# Patient Record
Sex: Male | Born: 1970 | Race: White | Hispanic: No | Smoking: Never smoker
Health system: Southern US, Community
[De-identification: ages and names within clinical notes are randomized; demographics above are authoritative.]

## PROBLEM LIST (undated history)

## (undated) DIAGNOSIS — N200 Calculus of kidney: Secondary | ICD-10-CM

## (undated) DIAGNOSIS — N289 Disorder of kidney and ureter, unspecified: Secondary | ICD-10-CM

## (undated) DIAGNOSIS — F1010 Alcohol abuse, uncomplicated: Secondary | ICD-10-CM

## (undated) HISTORY — PX: KIDNEY STONE SURGERY: SHX686

---

## 2004-05-09 ENCOUNTER — Ambulatory Visit: Payer: Self-pay | Admitting: Family Medicine

## 2004-07-28 ENCOUNTER — Ambulatory Visit: Payer: Self-pay | Admitting: Internal Medicine

## 2004-09-30 ENCOUNTER — Emergency Department (HOSPITAL_COMMUNITY): Admission: EM | Admit: 2004-09-30 | Discharge: 2004-10-01 | Payer: Self-pay | Admitting: Emergency Medicine

## 2007-02-04 IMAGING — CT CT CERVICAL SPINE W/O CM
3 series · 13 of 20 positions shown, 15 images · non-contrast
Comparison: None.
COMPARISON: None.

CLINICAL DATA: Trauma. Frontal contusion. Headache. Neck pain.

CRANIAL CT WITHOUT CONTRAST  10/01/2004:
TECHNIQUE: 5mm axial images were obtained from the skull base through the
vertex without intravenous contrast.
TECHNIQUE: Multidetector CT imaging of the cervical spine was performed without
contrast.  Sagittal and coronal plane reformatted images were reconstructed from
the axial CT data, and were also reviewed.

[Series 3: c_spine 3.0 b30s bone · axial · 0.26mm/px · z∈[-310,-184]mm · 7 of 57 slices shown, 9 images]
[im 8/57  soft-tissue]
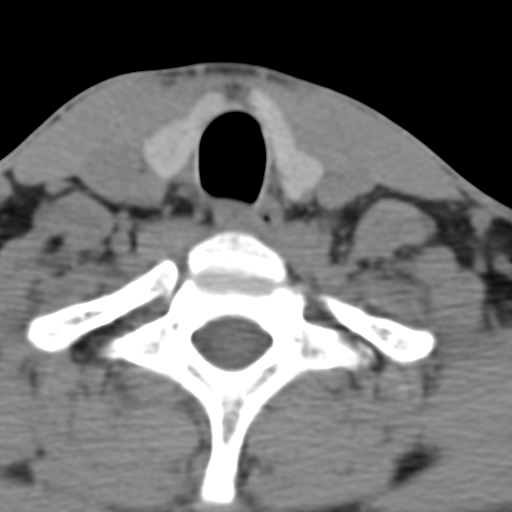
[im 8/57  bone]
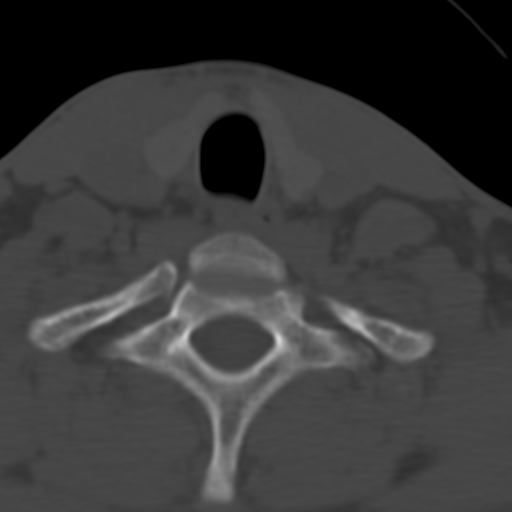
[im 15/57  bone]
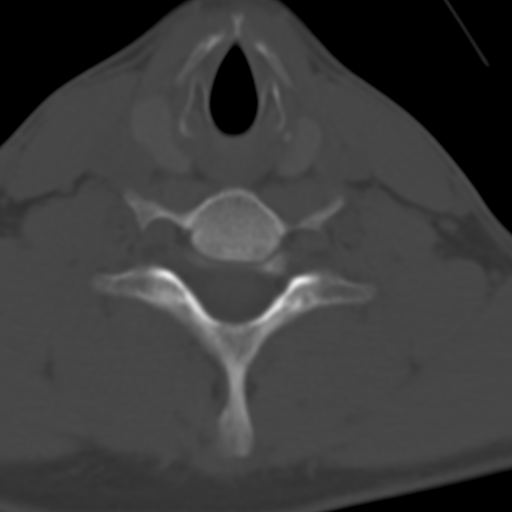
[im 22/57  bone]
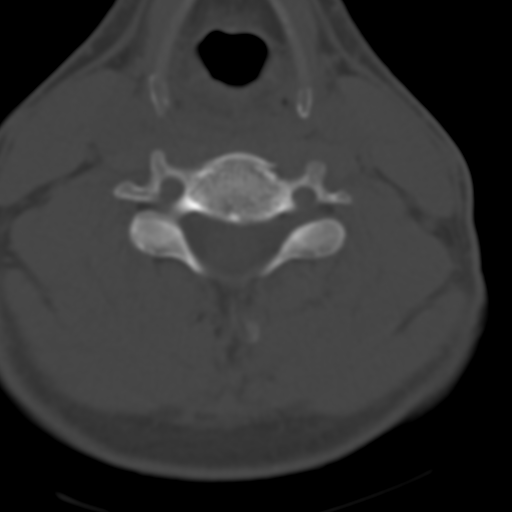
[im 29/57  bone]
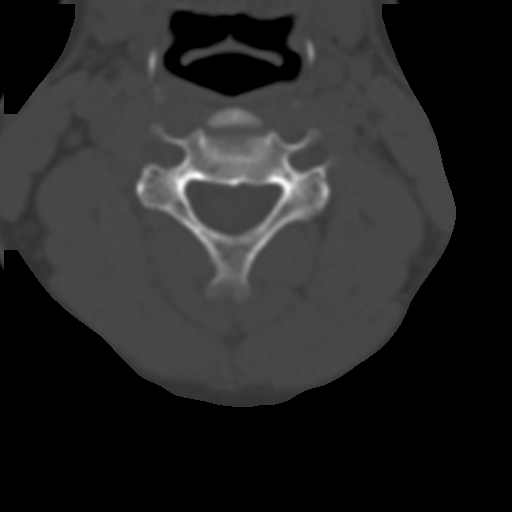
[im 36/57  soft-tissue]
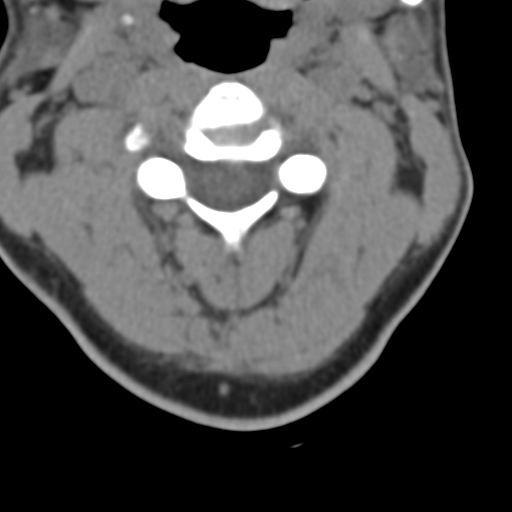
[im 36/57  bone]
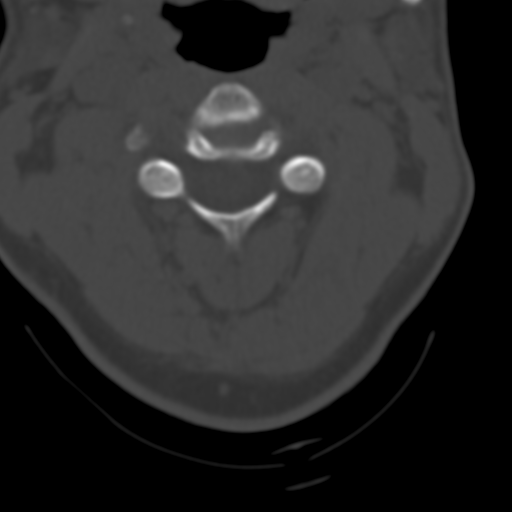
[im 43/57  bone]
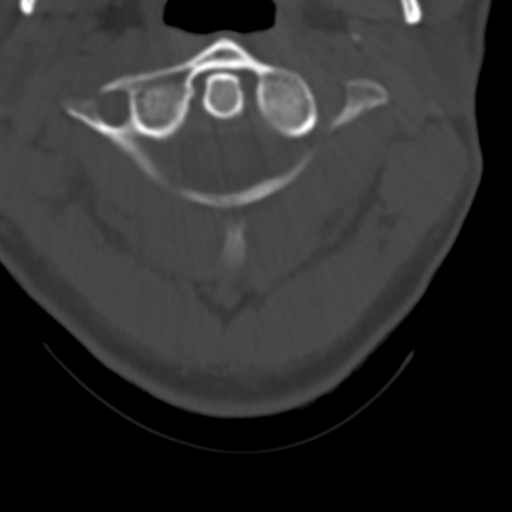
[im 50/57  bone]
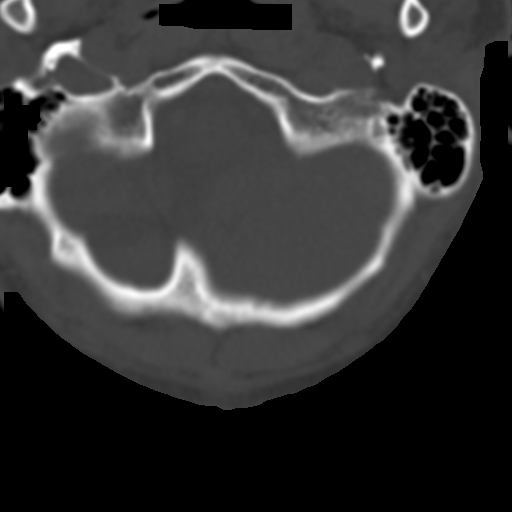

[Series 4: head_seq 4.5 h42s st · axial · 0.43mm/px · z∈[-138,-66]mm · 3 of 32 slices shown]
[im 8/32  bone]
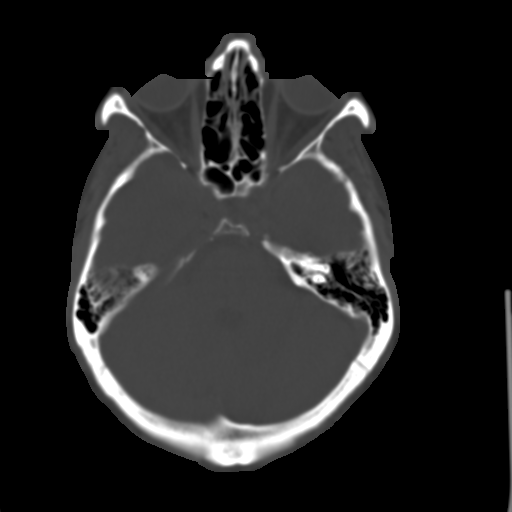
[im 16/32  bone]
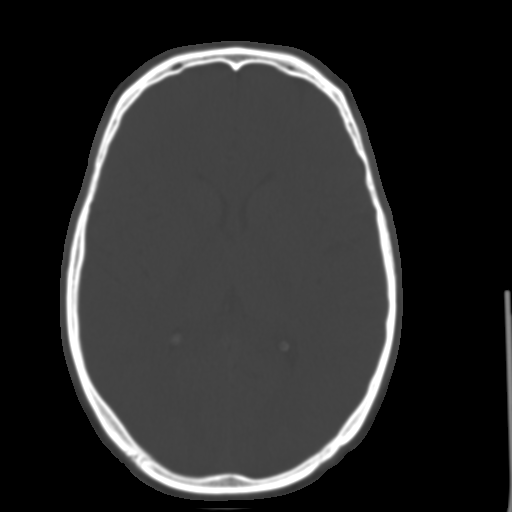
[im 24/32  bone]
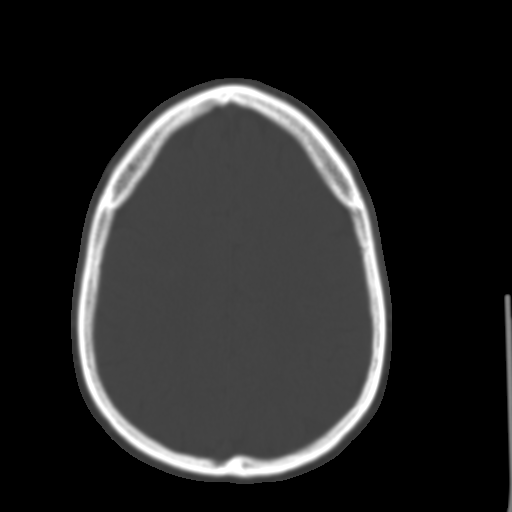

[Series 603: coronal cervical · coronal · 0.35mm/px · 3 of 21 slices shown]
[im 5/21  bone]
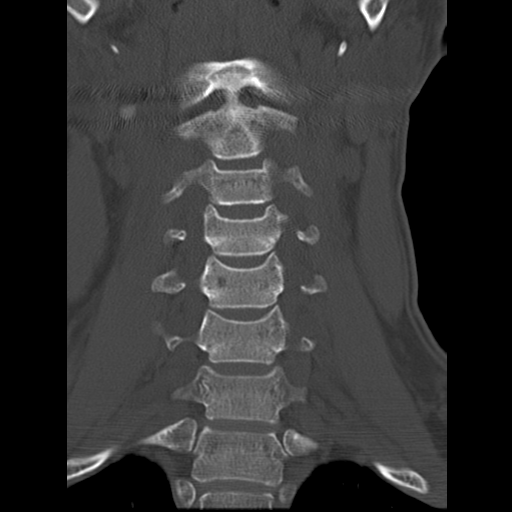
[im 9/21  bone]
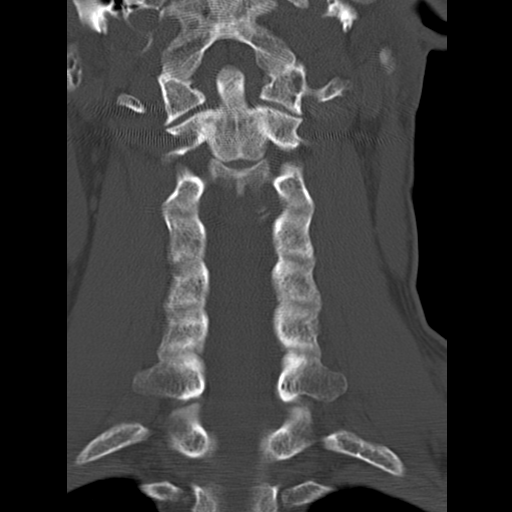
[im 13/21  bone]
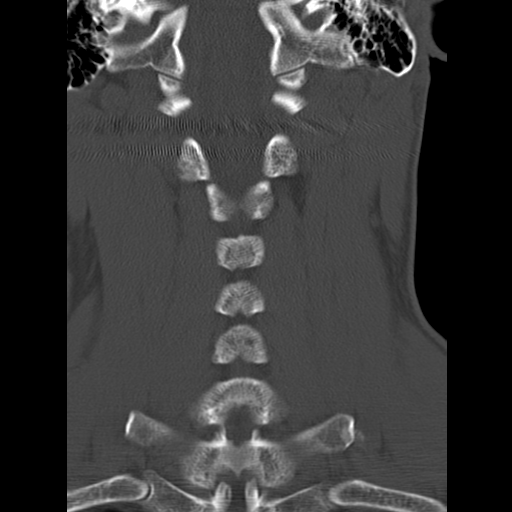

[13 of 20 positions shown; findings below may reference images not displayed]

FINDINGS: The ventricular system is normal in size and appearance for age.
There is no mass-effect or midline shift. There is no hemorrhage or hematoma. No
extra-axial fluid collections are identified. There are no focal brain
parenchymal abnormalities.

The bone window images demonstrate no skull fractures. The mastoid air cells
appear well-aerated. There is a mucous retention cyst or polyp in the right
maxillary sinus. Mucosal thickening is present in the ethmoid air cells
bilaterally. Note is made of a small subcutaneous/subgaleal hematoma in the
right frontal region.
IMPRESSION: 1. Normal intracranially.

2. Chronic right maxillary and bilateral ethmoid sinusitis. 

CT CERVICAL SPINE WITHOUT CONTRAST 10/01/2004:
FINDINGS: No fractures are identified involving the cervical spine. There is no
evidence of spinal stenosis. The sagittal reconstructed images show
straightening of the usual lordosis. Posterior alignment appears anatomic.
Prevertebral soft tissues are normal. There is calcification in the anterior
annular fibers of C6-C7. The facet joints are intact throughout. The coronal
reconstructed images show the dens to be intact. The C1-C2 articulation is
normal. The lateral masses are intact.
IMPRESSION: No cervical spine fracture is identified.

## 2016-06-10 ENCOUNTER — Emergency Department
Admission: EM | Admit: 2016-06-10 | Discharge: 2016-06-10 | Disposition: A | Discharge status: Home / Self Care | Payer: Self-pay | Attending: Emergency Medicine | Admitting: Emergency Medicine

## 2016-06-10 ENCOUNTER — Encounter: Payer: Self-pay | Admitting: Emergency Medicine

## 2016-06-10 DIAGNOSIS — F1010 Alcohol abuse, uncomplicated: Secondary | ICD-10-CM

## 2016-06-10 DIAGNOSIS — F10920 Alcohol use, unspecified with intoxication, uncomplicated: Secondary | ICD-10-CM

## 2016-06-10 DIAGNOSIS — F17290 Nicotine dependence, other tobacco product, uncomplicated: Secondary | ICD-10-CM | POA: Insufficient documentation

## 2016-06-10 DIAGNOSIS — F10120 Alcohol abuse with intoxication, uncomplicated: Secondary | ICD-10-CM | POA: Insufficient documentation

## 2016-06-10 HISTORY — DX: Calculus of kidney: N20.0

## 2016-06-10 HISTORY — DX: Disorder of kidney and ureter, unspecified: N28.9

## 2016-06-10 HISTORY — DX: Alcohol abuse, uncomplicated: F10.10

## 2016-06-10 LAB — COMPREHENSIVE METABOLIC PANEL
ALT: 46 U/L (ref 17–63)
AST: 55 U/L — AB (ref 15–41)
Albumin: 4.4 g/dL (ref 3.5–5.0)
Alkaline Phosphatase: 58 U/L (ref 38–126)
Anion gap: 8 (ref 5–15)
BILIRUBIN TOTAL: 0.6 mg/dL (ref 0.3–1.2)
BUN: 6 mg/dL (ref 6–20)
CHLORIDE: 108 mmol/L (ref 101–111)
CO2: 25 mmol/L (ref 22–32)
CREATININE: 0.82 mg/dL (ref 0.61–1.24)
Calcium: 8.9 mg/dL (ref 8.9–10.3)
GFR calc Af Amer: >60 mL/min (ref >60)
Glucose, Bld: 103 mg/dL — ABNORMAL HIGH (ref 65–99)
Potassium: 3.8 mmol/L (ref 3.5–5.1)
Sodium: 141 mmol/L (ref 135–145)
TOTAL PROTEIN: 7.3 g/dL (ref 6.5–8.1)

## 2016-06-10 LAB — SALICYLATE LEVEL: Salicylate Lvl: <7 mg/dL (ref 2.8–30.0)

## 2016-06-10 LAB — CBC WITH DIFFERENTIAL/PLATELET
BASOS ABS: 0.1 10*3/uL (ref 0–0.1)
Basophils Relative: 1 %
Eosinophils Absolute: 0.2 10*3/uL (ref 0–0.7)
Eosinophils Relative: 5 %
HEMATOCRIT: 43 % (ref 40.0–52.0)
HEMOGLOBIN: 15.1 g/dL (ref 13.0–18.0)
LYMPHS PCT: 40 %
Lymphs Abs: 1.8 10*3/uL (ref 1.0–3.6)
MCH: 35.3 pg — ABNORMAL HIGH (ref 26.0–34.0)
MCHC: 35.1 g/dL (ref 32.0–36.0)
MCV: 100.5 fL — AB (ref 80.0–100.0)
Monocytes Absolute: 0.5 10*3/uL (ref 0.2–1.0)
Monocytes Relative: 11 %
NEUTROS ABS: 2 10*3/uL (ref 1.4–6.5)
Neutrophils Relative %: 43 %
Platelets: 304 10*3/uL (ref 150–440)
RBC: 4.28 MIL/uL — AB (ref 4.40–5.90)
RDW: 14.8 % — ABNORMAL HIGH (ref 11.5–14.5)
WBC: 4.6 10*3/uL (ref 3.8–10.6)

## 2016-06-10 LAB — ETHANOL
ALCOHOL ETHYL (B): 200 mg/dL — AB (ref <5)
Alcohol, Ethyl (B): 111 mg/dL — ABNORMAL HIGH (ref <5)

## 2016-06-10 LAB — ACETAMINOPHEN LEVEL: Acetaminophen (Tylenol), Serum: <10 ug/mL — ABNORMAL LOW (ref 10–30)

## 2016-06-10 MED ORDER — THIAMINE HCL 100 MG/ML IJ SOLN
Freq: Once | INTRAVENOUS | Status: AC
  Administered 2016-06-10: 05:00:00 via INTRAVENOUS
  Filled 2016-06-10: qty 1000

## 2016-06-10 MED ORDER — LORAZEPAM 2 MG PO TABS: 0.0000 mg | ORAL_TABLET | Freq: Four times a day (QID) | ORAL | Status: DC

## 2016-06-10 MED ORDER — LORAZEPAM 2 MG/ML IJ SOLN
0.0000 mg | Freq: Four times a day (QID) | INTRAMUSCULAR | Status: DC
Start: 2016-06-10 — End: 2016-06-10

## 2016-06-10 MED ORDER — VITAMIN B-1 100 MG PO TABS
100.0000 mg | ORAL_TABLET | Freq: Every day | ORAL | Status: DC
  Administered 2016-06-10: 100 mg via ORAL
  Filled 2016-06-10: qty 1

## 2016-06-10 MED ORDER — LORAZEPAM 1 MG PO TABS
1.0000 mg | ORAL_TABLET | Freq: Once | ORAL | Status: AC
  Administered 2016-06-10: 1 mg via ORAL
  Filled 2016-06-10: qty 1

## 2016-06-10 NOTE — Discharge Instructions (Signed)
Proceed directly to [facility].  Return to the ER for worsening symptoms, persistent vomiting, difficulty breathing or other concerns. °

## 2016-06-10 NOTE — ED Notes (Signed)
BEHAVIORAL HEALTH ROUNDING Patient sleeping: Yes.   Patient alert and oriented: not applicable SLEEPING Behavior appropriate: Yes.  ; If no, describe: SLEEPING Nutrition and fluids offered: No SLEEPING Toileting and hygiene offered: NoSLEEPING Sitter present: not applicable, Q 15 min safety rounds and observation. Law enforcement present: Yes ODS 

## 2016-06-10 NOTE — ED Notes (Signed)

## 2016-06-10 NOTE — ED Provider Notes (Signed)
[facility] Emergency Department Provider Note   ____________________________________________   First MD Initiated Contact with Patient 06/10/16 0400     (approximate)  I have reviewed the triage vital signs and the nursing notes.   HISTORY  Chief Complaint Alcohol Intoxication    HPI Bishop Limboimothy Keddy is a 45 y.o. male who presents to the ED from home with a chief complaint of seeking alcohol detox. Patient reports he is a "severe alcoholic" with prior DTs. His mother kicked him out of the home last evening after he lost his job secondary to his drinking. [facility] went to [facility] for [facility] who referred him to the emergency department for breathalyzer over 200. Denies SI/HI/AH/VH. Last drink was approximately 8 PM. Complains of restlessness and tremors. Denies recent fever, chills, chest pain, shortness of breath, abdominal pain, nausea, vomiting, diarrhea. Denies recent travel or trauma. Nothing makes his symptoms better or worse.   Past Medical History:  Diagnosis Date  . Alcohol abuse   . Kidney stone   . Renal disorder     There are no active problems to display for this patient.   Past Surgical History:  Procedure Laterality Date  . KIDNEY STONE SURGERY      Prior to Admission medications   Not on File    Allergies Patient has no known allergies.  No family history on file.  Social History Social History  Substance Use Topics  . Smoking status: Never Smoker  . Smokeless tobacco: Current User    Types: Snuff  . Alcohol use Yes     Comment: 1 pt of vodka a day or a 5th of wine a day    Review of Systems  Constitutional: No fever/chills. Eyes: No visual changes. ENT: No sore throat. Cardiovascular: Denies chest pain. Respiratory: Denies shortness of breath. Gastrointestinal: No abdominal pain.  No nausea, no vomiting.  No diarrhea.  No constipation. Genitourinary: Negative for dysuria. Musculoskeletal: Negative for back  pain. Skin: Negative for rash. Neurological: Negative for headaches, focal weakness or numbness. Psychiatric:Positive for alcoholism  10-point ROS otherwise negative.  ____________________________________________   PHYSICAL EXAM:  VITAL SIGNS: ED Triage Vitals  Enc Vitals Group     BP 06/10/16 0345 (!) 149/105     Pulse Rate 06/10/16 0345 (!) 103     Resp 06/10/16 0345 20     Temp 06/10/16 0345 97.5 F (36.4 C)     Temp Source 06/10/16 0345 Oral     SpO2 06/10/16 0345 100 %     Weight 06/10/16 0346 150 lb (68 kg)     Height 06/10/16 0346 5\' 7"  (1.702 m)     Head Circumference --      Peak Flow --      Pain Score --      Pain Loc --      Pain Edu? --      Excl. in GC? --     Constitutional: Alert and oriented. Intoxicated appearing and in no acute distress. Eyes: Conjunctivae are normal. PERRL. EOMI. Head: Atraumatic. Nose: No congestion/rhinnorhea. Mouth/Throat: Mucous membranes are moist.  Oropharynx non-erythematous. Neck: No stridor.   Cardiovascular: Normal rate, regular rhythm. Grossly normal heart sounds.  Good peripheral circulation. Respiratory: Normal respiratory effort.  No retractions. Lungs CTAB. Gastrointestinal: Soft and nontender. No distention. No abdominal bruits. No CVA tenderness. Musculoskeletal: No lower extremity tenderness nor edema.  No joint effusions. Neurologic:  Normal speech and language. No gross focal neurologic deficits are appreciated. No gait instability.  Skin:  Skin is warm, dry and intact. No rash noted. Psychiatric: Mood and affect are normal. Speech and behavior are normal.  ____________________________________________   LABS (all labs ordered are listed, but only abnormal results are displayed)  Labs Reviewed  CBC WITH DIFFERENTIAL/PLATELET - Abnormal; Notable for the following:       Result Value   RBC 4.28 (*)    MCV 100.5 (*)    MCH 35.3 (*)    RDW 14.8 (*)    All other components within normal limits  COMPREHENSIVE  METABOLIC PANEL - Abnormal; Notable for the following:    Glucose, Bld 103 (*)    AST 55 (*)    All other components within normal limits  ETHANOL - Abnormal; Notable for the following:    Alcohol, Ethyl (B) 200 (*)    All other components within normal limits  ACETAMINOPHEN LEVEL - Abnormal; Notable for the following:    Acetaminophen (Tylenol), Serum <10 (*)    All other components within normal limits  SALICYLATE LEVEL   ____________________________________________  EKG  None ____________________________________________  RADIOLOGY  None ____________________________________________   PROCEDURES  Procedure(s) performed: None  Procedures  Critical Care performed: No  ____________________________________________   INITIAL IMPRESSION / ASSESSMENT AND PLAN / ED COURSE  Pertinent labs & imaging results that were available during my care of the patient were reviewed by me and considered in my medical decision making (see chart for details).  45 year old male with EtOH dependency who presents to the ED requesting detox after he was sent from [facility] for medical clearance. EtOH level is 200; [facility] will accept at 100 or below. Will infuse banana bag, place patient on CIWA protocol and observe.  Clinical Course as of Jun 10 652  Wynelle LinkSun Jun 10, 2016  [id number] [facility] requested a repeat EtOH level which will be drawn at 8 AM. Hopefully patient will be able to go to [facility] for detox.  [JS]    Clinical Course User Index [JS] Irean HongJade J Sung, MD     ____________________________________________   FINAL CLINICAL IMPRESSION(S) / ED DIAGNOSES  Final diagnoses:  ETOH abuse  Acute alcoholic intoxication without complication (HCC)      NEW MEDICATIONS STARTED DURING THIS VISIT:  New Prescriptions   No medications on file     Note:  This document was prepared using Dragon voice recognition software and may include unintentional dictation errors.    Irean HongJade J Sung, MD 06/10/16 [phone]

## 2016-06-10 NOTE — ED Notes (Signed)
BEHAVIORAL HEALTH ROUNDING Patient sleeping: Yes.   Patient alert and oriented: not applicable Behavior appropriate: Yes.  ; If no, describe:  Nutrition and fluids offered: No Toileting and hygiene offered: No Sitter present: not applicable Law enforcement present: Yes  

## 2016-06-10 NOTE — BH Specialist Note (Signed)
This Clinical research associatewriter spoke with Joni ReiningNicole [facility] intake nurse who is agreeing to the accept the patient with the current BAC results.  She reports that their transportation person is not available at this time. This Clinical research associatewriter contacted Lurena JoinerRebecca with [employer] [facility] [facility] who reports a Zenaida Niecevan will be here to pick up the patient in 35-40 minutes.  This information was communicated with Annette StableBill, RN and he agreed.     Maryelizabeth Rowanressa Baileigh Modisette, MSW, Clare CharonLCSW, LCAS [facility] Triage Specialist [phone]

## 2016-06-10 NOTE — BH Assessment (Signed)
Assessment Note  Joseph Walters is an 45 y.o. male  presenting to the ED with concerns  of increased depression and anxiety as well as alcoholism.  Patient denies SI/HI/Psychosis/SIB. He reports daily use of ETOH. Patient reports drinking about a pint of wine/liquor daily but states he drank a fifth of liquor yesterday. ETOH level of arrival at ED = 200. Patient reports recent job loss associated with ETOH use as well as financial and family problems he attributes to his addiction. Patient denies any current MH/SA treatment.   Diagnosis: Alcohol abuse  Past Medical History:  Past Medical History:  Diagnosis Date  . Alcohol abuse   . Kidney stone   . Renal disorder     Past Surgical History:  Procedure Laterality Date  . KIDNEY STONE SURGERY      Family History: No family history on file.  Social History:  reports that he has never smoked. His smokeless tobacco use includes Snuff. He reports that he drinks alcohol. He reports that he does not use drugs.  Additional Social History:  Alcohol / Drug Use Pain Medications: See PTA Prescriptions: See PTA Over the Counter: See PTA History of alcohol / drug use?: Yes Longest period of sobriety (when/how long): 9 months Negative Consequences of Use: Financial, Armed forces operational officerLegal, Work / Programmer, multimediachool, Personal relationships Withdrawal Symptoms: Tremors Substance #1 Name of Substance 1: ETOH 1 - Age of First Use: 37 1 - Amount (size/oz): 1/5 liquor 1 - Frequency: daily 1 - Duration: past couple of months 1 - Last Use / Amount: 06/09/2016  CIWA: CIWA-Ar BP: (!) 149/105 Pulse Rate: (!) 103 Nausea and Vomiting: no nausea and no vomiting Tactile Disturbances: none Tremor: not visible, but can be felt fingertip to fingertip Auditory Disturbances: not present Paroxysmal Sweats: no sweat visible Visual Disturbances: not present Anxiety: mildly anxious Headache, Fullness in Head: mild Agitation: normal activity Orientation and Clouding of Sensorium:  oriented and can do serial additions CIWA-Ar Total: 4 COWS:    Allergies: No Known Allergies  Home Medications:  (Not in a hospital admission)  OB/GYN Status:  No LMP for male patient.  General Assessment Data [facility] Assessment: In system Is this a Tele or Face-to-Face Assessment?: Face-to-Face Is this an Initial Assessment or a Re-assessment for this encounter?: Initial Assessment Marital status: Single Maiden name: n/a Is patient pregnant?: No Pregnancy Status: No Living Arrangements: Parent Can pt return to current living arrangement?: Yes Admission Status: Voluntary Is patient capable of signing voluntary admission?: Yes Referral Source: Self/Family/Friend Insurance type: None  Medical Screening Exam [facility] Walk-in ONLY) Medical Exam completed: Yes  Crisis Care Plan Living Arrangements: Parent Legal Guardian: Other: (self) Name of Psychiatrist: none reported Name of Therapist: none reported  Education Status Is patient currently in school?: No Current Grade: n/a Highest grade of school patient has completed: n/a Name of school: n/a Contact person: n/a  Risk to self with the past 6 months Suicidal Ideation: No Has patient been a risk to self within the past 6 months prior to admission? : No Suicidal Intent: No Has patient had any suicidal intent within the past 6 months prior to admission? : No Is patient at risk for suicide?: No Suicidal Plan?: No Has patient had any suicidal plan within the past 6 months prior to admission? : No Access to Means: No What has been your use of drugs/alcohol within the last 12 months?: alcohol,1/5 pint daily Previous Attempts/Gestures: No How many times?: 0 Other Self Harm Risks:  none identified Triggers for Past Attempts: None known Intentional Self Injurious Behavior: None Family Suicide History: No Recent stressful life event(s): Legal Issues, Job Loss Persecutory voices/beliefs?:  No Depression: Yes Depression Symptoms: Loss of interest in usual pleasures, Feeling worthless/self pity Substance abuse history and/or treatment for substance abuse?: Yes Suicide prevention information given to non-admitted patients: Not applicable  Risk to Others within the past 6 months Homicidal Ideation: No Does patient have any lifetime risk of violence toward others beyond the six months prior to admission? : No Thoughts of Harm to Others: No Current Homicidal Intent: No Current Homicidal Plan: No Access to Homicidal Means: No Identified Victim: none identified History of harm to others?: No Assessment of Violence: None Noted Violent Behavior Description: none  identified Does patient have access to weapons?: No Criminal Charges Pending?: Yes Describe Pending Criminal Charges: DWI Does patient have a court date: Yes Court Date: 08/03/15 Is patient on probation?: No  Psychosis Hallucinations: None noted Delusions: None noted  Mental Status Report Appearance/Hygiene: Unremarkable Eye Contact: Good Motor Activity: Freedom of movement, Tremors Speech: Logical/coherent Level of Consciousness: Drowsy Mood: Depressed, Anxious Affect: Anxious, Depressed Anxiety Level: Minimal Thought Processes: Coherent, Relevant Judgement: Unimpaired Orientation: Place, Person, Time, Situation Obsessive Compulsive Thoughts/Behaviors: None  Cognitive Functioning Concentration: Good Memory: Recent Intact, Remote Intact IQ: Average Insight: Fair Impulse Control: Fair Appetite: Fair Weight Loss: 0 Weight Gain: 0 Sleep: No Change Vegetative Symptoms: None  ADLScreening [facility] Assessment Services) Patient's cognitive ability adequate to safely complete daily activities?: Yes Patient able to express need for assistance with ADLs?: Yes Independently performs ADLs?: Yes (appropriate for developmental age)  Prior Inpatient Therapy Prior Inpatient Therapy: No Prior Therapy Dates:  n/a Prior Therapy Facilty/Provider(s): n/a Reason for Treatment: n/a  Prior Outpatient Therapy Prior Outpatient Therapy: No Prior Therapy Dates: n/a Prior Therapy Facilty/Provider(s): n/a Reason for Treatment: n/a Does patient have an ACCT team?: No Does patient have Intensive In-House Services?  : No Does patient have [facility] services? : No Does patient have P4CC services?: No  ADL Screening (condition at time of admission) Patient's cognitive ability adequate to safely complete daily activities?: Yes Patient able to express need for assistance with ADLs?: Yes Independently performs ADLs?: Yes (appropriate for developmental age)       Abuse/Neglect Assessment (Assessment to be complete while patient is alone) Physical Abuse: Denies Verbal Abuse: Denies Sexual Abuse: Denies Exploitation of patient/patient's resources: Denies Self-Neglect: Denies Values / Beliefs Cultural Requests During Hospitalization: None Spiritual Requests During Hospitalization: None Consults Spiritual Care Consult Needed: No Social Work Consult Needed: No Merchant navy officerAdvance Directives (For Healthcare) Does Patient Have a Medical Advance Directive?: No Would patient like information on creating a medical advance directive?: No - Patient declined    Additional Information 1:1 In Past 12 Months?: No CIRT Risk: No Elopement Risk: No Does patient have medical clearance?: No     Disposition:  Disposition Initial Assessment Completed for this Encounter: Yes Disposition of Patient: Referred to Patient referred to: [facility]  On Site Evaluation by:   Reviewed with Physician:    Artist Beachoxana C Destry Dauber 06/10/2016 5:18 AM

## 2016-06-10 NOTE — ED Provider Notes (Signed)
[facility]  I accepted care from Dr. Dolores FrameSung ____________________________________________    LABS (pertinent positives/negatives)  Labs Reviewed  CBC WITH DIFFERENTIAL/PLATELET - Abnormal; Notable for the following:       Result Value   RBC 4.28 (*)    MCV 100.5 (*)    MCH 35.3 (*)    RDW 14.8 (*)    All other components within normal limits  COMPREHENSIVE METABOLIC PANEL - Abnormal; Notable for the following:    Glucose, Bld 103 (*)    AST 55 (*)    All other components within normal limits  ETHANOL - Abnormal; Notable for the following:    Alcohol, Ethyl (B) 200 (*)    All other components within normal limits  ACETAMINOPHEN LEVEL - Abnormal; Notable for the following:    Acetaminophen (Tylenol), Serum <10 (*)    All other components within normal limits  ETHANOL - Abnormal; Notable for the following:    Alcohol, Ethyl (B) 111 (*)    All other components within normal limits  SALICYLATE LEVEL     ____________________________________________    RADIOLOGY All xrays were viewed by me. Imaging interpreted by radiologist.  None  ____________________________________________   PROCEDURES  Procedure(s) performed: None  Critical Care performed: None  ____________________________________________   INITIAL IMPRESSION / ASSESSMENT AND PLAN / ED COURSE   Pertinent labs & imaging results that were available during my care of the patient were reviewed by me and considered in my medical decision making (see chart for details).  Patient voluntarily here to be evaluated because he went to [facility] and they requested his alcohol level be hear 100.  Repeat is 111, patient is calm and cooperative.  States he feels jittery, given a dose of ativan, but appears safe for discharge to [facility]. Patient will be discharged to go to [facility].  CONSULTATIONS: None    Patient / Family / Caregiver informed of clinical course, medical decision-making process, and agree with  plan.   I discussed return precautions, follow-up instructions, and discharged instructions with patient and/or family.     ____________________________________________   FINAL CLINICAL IMPRESSION(S) / ED DIAGNOSES  Final diagnoses:  ETOH abuse  Acute alcoholic intoxication without complication (HCC)        Governor Rooksebecca Finlay Godbee, MD 06/10/16 1110

## 2016-06-10 NOTE — ED Notes (Signed)
BEHAVIORAL HEALTH ROUNDING Patient sleeping: Yes.   Patient alert and oriented: not applicable Behavior appropriate: Yes.  ; If no, describe:  Nutrition and fluids offered: No Toileting and hygiene offered: Yes  Sitter present: not applicable Law enforcement present: Yes  

## 2016-06-10 NOTE — ED Triage Notes (Signed)
Pt states he "is a severe alcoholic" and he is having trouble stopping. Last drink was around 2000 last night. Pt states he has attempted to stop several times but has seizures and tremors from alcohol withdrawals. Pt states he wants to quit but he cant do it on his own.

## 2017-07-02 ENCOUNTER — Emergency Department (HOSPITAL_COMMUNITY)
Admission: EM | Admit: 2017-07-02 | Discharge: 2017-07-02 | Disposition: A | Discharge status: Home / Self Care | Payer: Self-pay | Attending: Physician Assistant | Admitting: Physician Assistant

## 2017-07-02 ENCOUNTER — Encounter (HOSPITAL_COMMUNITY): Payer: Self-pay

## 2017-07-02 ENCOUNTER — Other Ambulatory Visit: Payer: Self-pay

## 2017-07-02 DIAGNOSIS — R109 Unspecified abdominal pain: Secondary | ICD-10-CM | POA: Insufficient documentation

## 2017-07-02 DIAGNOSIS — G4700 Insomnia, unspecified: Secondary | ICD-10-CM | POA: Insufficient documentation

## 2017-07-02 LAB — URINALYSIS, ROUTINE W REFLEX MICROSCOPIC
Bilirubin Urine: NEGATIVE
GLUCOSE, UA: NEGATIVE mg/dL
Ketones, ur: NEGATIVE mg/dL
Leukocytes, UA: NEGATIVE
Nitrite: NEGATIVE
PROTEIN: NEGATIVE mg/dL
Specific Gravity, Urine: 1.014 (ref 1.005–1.030)
Squamous Epithelial / LPF: NONE SEEN
pH: 7 (ref 5.0–8.0)

## 2017-07-02 LAB — COMPREHENSIVE METABOLIC PANEL
ALBUMIN: 3.9 g/dL (ref 3.5–5.0)
ALK PHOS: 59 U/L (ref 38–126)
ALT: 40 U/L (ref 17–63)
AST: 30 U/L (ref 15–41)
Anion gap: 7 (ref 5–15)
BUN: 9 mg/dL (ref 6–20)
CALCIUM: 9.2 mg/dL (ref 8.9–10.3)
CO2: 30 mmol/L (ref 22–32)
CREATININE: 0.74 mg/dL (ref 0.61–1.24)
Chloride: 101 mmol/L (ref 101–111)
GFR calc Af Amer: >60 mL/min (ref >60)
GFR calc non Af Amer: >60 mL/min (ref >60)
GLUCOSE: 92 mg/dL (ref 65–99)
Potassium: 3.8 mmol/L (ref 3.5–5.1)
SODIUM: 138 mmol/L (ref 135–145)
Total Bilirubin: 0.4 mg/dL (ref 0.3–1.2)
Total Protein: 6.7 g/dL (ref 6.5–8.1)

## 2017-07-02 LAB — CBC
HCT: 37.8 % — ABNORMAL LOW (ref 39.0–52.0)
HEMOGLOBIN: 12.9 g/dL — AB (ref 13.0–17.0)
MCH: 33.4 pg (ref 26.0–34.0)
MCHC: 34.1 g/dL (ref 30.0–36.0)
MCV: 97.9 fL (ref 78.0–100.0)
PLATELETS: 221 10*3/uL (ref 150–400)
RBC: 3.86 MIL/uL — ABNORMAL LOW (ref 4.22–5.81)
RDW: 13.5 % (ref 11.5–15.5)
WBC: 3.4 10*3/uL — ABNORMAL LOW (ref 4.0–10.5)

## 2017-07-02 LAB — LIPASE, BLOOD: Lipase: 40 U/L (ref 11–51)

## 2017-07-02 LAB — POC OCCULT BLOOD, ED: Fecal Occult Bld: NEGATIVE

## 2017-07-02 MED ORDER — DICYCLOMINE HCL 20 MG PO TABS: 20.0000 mg | ORAL_TABLET | Freq: Three times a day (TID) | ORAL | 0 refills | Status: DC

## 2017-07-02 NOTE — ED Provider Notes (Signed)
[facility]   CSN: [id number] Arrival date & time: 07/02/17  [id number]     History   Chief Complaint Chief Complaint  Patient presents with  . Abdominal Pain    HPI Joseph Walters is a 46 y.o. male who presents today for evaluation of chronic abdominal pain and chronic insomnia.  He reports that he was seen by GI in the past for this and diagnosed with colitis and did not take his antibiotics.  He reports he was started on bentyl and took two pills multiple months ago before he lost it.  He says his pain comes and goes with out any known trigger.  He does report that his pain worsens when he eats a "heavy meal" but it is still inconsistent.  He was seen by GI earlier this year when it started.  He reports occasional dark tarry sticky stools but none with in the past week.  He has not tried anything recently.    He reports insomnia that has been present for multiple years.  He used to take Ambien but has not had any recently.  He is not familiar with sleep hygiene. He complains "No one wants to give me Remus Lofflerambien any more." He asks about multiple different medications for insomnia and if I would prescribe them for him.  He will be getting insurance starting next week.  He does not have a local PCP.   HPI  Past Medical History:  Diagnosis Date  . Alcohol abuse   . Kidney stone   . Renal disorder     There are no active problems to display for this patient.   Past Surgical History:  Procedure Laterality Date  . KIDNEY STONE SURGERY         Home Medications    Prior to Admission medications   Medication Sig Start Date End Date Taking? Authorizing Provider  dicyclomine (BENTYL) 20 MG tablet Take 1 tablet (20 mg total) by mouth 4 (four) times daily -  before meals and at bedtime for 10 days. 07/02/17 07/12/17  Cristina GongHammond, Lilyana Lippman W, PA-C    Family History No family history on file.  Social History Social History   Tobacco Use   . Smoking status: Never Smoker  . Smokeless tobacco: Current User    Types: Snuff  Substance Use Topics  . Alcohol use: Yes    Comment: 1 pt of vodka a day or a 5th of wine a day  . Drug use: No     Allergies   Patient has no known allergies.   Review of Systems Review of Systems  Constitutional: Negative for chills and fever.  HENT: Negative for ear pain and sore throat.   Eyes: Negative for pain and visual disturbance.  Respiratory: Negative for cough and shortness of breath.   Cardiovascular: Negative for chest pain and palpitations.  Gastrointestinal: Positive for abdominal pain and blood in stool. Negative for diarrhea, nausea and vomiting.  Genitourinary: Negative for dysuria, frequency, hematuria and urgency.  Musculoskeletal: Negative for arthralgias and back pain.  Skin: Negative for color change and rash.  Neurological: Negative for seizures and syncope.  Psychiatric/Behavioral: Positive for sleep disturbance.  All other systems reviewed and are negative.    Physical Exam Updated Vital Signs BP (!) 132/93   Pulse 76   Temp 98.3 F (36.8 C) (Oral)   Resp 16   Ht 5\' 8"  (1.727 m)   Wt 72.6 kg (160 lb)   SpO2 98%  BMI 24.33 kg/m   Physical Exam  Constitutional: He appears well-developed and well-nourished. No distress.  HENT:  Head: Normocephalic and atraumatic.  Eyes: Conjunctivae are normal. Right eye exhibits no discharge. Left eye exhibits no discharge. No scleral icterus.  Neck: Normal range of motion.  Cardiovascular: Normal rate, regular rhythm and normal heart sounds.  No murmur heard. Pulmonary/Chest: Effort normal and breath sounds normal. No stridor. No respiratory distress.  Abdominal: Normal appearance and bowel sounds are normal. He exhibits no distension. There is no tenderness. There is no rigidity, no rebound, no guarding and no CVA tenderness.  Genitourinary: Rectal exam shows external hemorrhoid. Rectal exam shows no internal  hemorrhoid, no mass, no tenderness and anal tone normal.  Genitourinary Comments: Hemeoccult negative.   Musculoskeletal: He exhibits no edema or deformity.  Neurological: He is alert. He exhibits normal muscle tone.  Skin: Skin is warm and dry. He is not diaphoretic.  Psychiatric: He has a normal mood and affect. His behavior is normal. His affect is not angry and not inappropriate. His speech is rapid and/or pressured. He is not actively hallucinating. He is attentive.  Nursing note and vitals reviewed.    ED Treatments / Results  Labs (all labs ordered are listed, but only abnormal results are displayed) Labs Reviewed  CBC - Abnormal; Notable for the following components:      Result Value   WBC 3.4 (*)    RBC 3.86 (*)    Hemoglobin 12.9 (*)    HCT 37.8 (*)    All other components within normal limits  URINALYSIS, ROUTINE W REFLEX MICROSCOPIC - Abnormal; Notable for the following components:   APPearance CLOUDY (*)    Hgb urine dipstick SMALL (*)    Bacteria, UA RARE (*)    All other components within normal limits  LIPASE, BLOOD  COMPREHENSIVE METABOLIC PANEL  POC OCCULT BLOOD, ED  GC/CHLAMYDIA PROBE AMP ([place]) NOT AT [facility]    EKG  EKG Interpretation None       Radiology No results found.  Procedures Procedures (including critical care time)  Medications Ordered in ED Medications - No data to display   Initial Impression / Assessment and Plan / ED Course  I have reviewed the triage vital signs and the nursing notes.  Pertinent labs & imaging results that were available during my care of the patient were reviewed by me and considered in my medical decision making (see chart for details).  Clinical Course as of Jul 02 1338  Tue Jul 02, 2017  1236 Rectal exam performed in presence of chaperone.   [EH]    Clinical Course User Index [EH] Cristina GongHammond, Avanthika Dehnert W, PA-C   Patient is nontoxic, nonseptic appearing, in no apparent distress.  Patient abdomen is  soft, non tender, non distended, no peritoneal signs.   Labs, and vitals reviewed.  Patient does not meet the SIRS or Sepsis criteria.   No indication of appendicitis, bowel obstruction, bowel perforation, cholecystitis, diverticulitis.  Patient discharged home with symptomatic treatment, bentyl and given strict instructions for follow-up with their primary care physician/GI.  Heme-occult negative.  Given patient's pain has been present for about 9 months I have a low suspicion for an acute condition requiring emergency medical care.   Regarding his insomnia I explained that it is not appropriate to treat his multiple years of insomnia with an [place] rx from the ED.  I provided him with a hand out on sleep hygiene and recommended OTC melatonin.  He  was cautioned about safety regarding being drowsy.  He was instructed to obtain a primary care provider and follow up with them regarding his symptoms.  He is able to sleep just has difficulty falling asleep and staying asleep.  I have also discussed reasons to return immediately to the ER.  Patient expresses understanding and agrees with plan.  Final Clinical Impressions(s) / ED Diagnoses   Final diagnoses:  Abdominal pain, unspecified abdominal location  Insomnia, unspecified type    ED Discharge Orders        Ordered    dicyclomine (BENTYL) 20 MG tablet  3 times daily before meals & bedtime     07/02/17 1253       Cristina Gong, [place] 07/02/17 1339    Abelino Derrick, MD 07/04/17 [phone]

## 2017-07-02 NOTE — ED Triage Notes (Signed)
Pt reports left sided abdominal spasms x 1 week. PT states his GI doctor moved and he has not been able to see anyone for this. PT reports he is out of bentyl and thinks he needs something different. No n/v/diarrhea.

## 2017-07-02 NOTE — Discharge Instructions (Signed)
Please follow up with gastroenterology for your abdominal pain.  Please try taking bentyl for your pain.   Please follow the sleep hygiene tips given to you.  You may try melatonin which is available over the counter.  Please obtain a primary care doctor and follow up with them regarding your ED visit today.   If you are drowsy or falling asleep during the day then please do not drive, operate heavy machinery or perform any task that would be dangerous to you or others if you were to fall asleep.

## 2017-07-03 LAB — GC/CHLAMYDIA PROBE AMP (~~LOC~~) NOT AT ARMC
CHLAMYDIA, DNA PROBE: NEGATIVE
Neisseria Gonorrhea: NEGATIVE

## 2020-04-18 ENCOUNTER — Other Ambulatory Visit: Payer: Self-pay

## 2020-04-18 ENCOUNTER — Emergency Department (HOSPITAL_COMMUNITY): Admission: EM | Admit: 2020-04-18 | Discharge: 2020-04-18 | Discharge status: Against Medical Advice | Payer: Self-pay

## 2020-04-18 NOTE — ED Notes (Signed)
Pt called 4x no response  

## 2020-04-18 NOTE — ED Notes (Signed)
Pt called 2x no response  

## 2020-12-02 ENCOUNTER — Other Ambulatory Visit: Payer: Self-pay

## 2020-12-02 ENCOUNTER — Emergency Department (HOSPITAL_COMMUNITY)
Admission: EM | Admit: 2020-12-02 | Discharge: 2020-12-02 | Disposition: A | Discharge status: Home / Self Care | Payer: Self-pay | Attending: Emergency Medicine | Admitting: Emergency Medicine

## 2020-12-02 ENCOUNTER — Encounter (HOSPITAL_COMMUNITY): Payer: Self-pay

## 2020-12-02 DIAGNOSIS — F10129 Alcohol abuse with intoxication, unspecified: Secondary | ICD-10-CM | POA: Insufficient documentation

## 2020-12-02 DIAGNOSIS — X58XXXA Exposure to other specified factors, initial encounter: Secondary | ICD-10-CM | POA: Insufficient documentation

## 2020-12-02 DIAGNOSIS — S0031XA Abrasion of nose, initial encounter: Secondary | ICD-10-CM | POA: Insufficient documentation

## 2020-12-02 DIAGNOSIS — F10920 Alcohol use, unspecified with intoxication, uncomplicated: Secondary | ICD-10-CM

## 2020-12-02 NOTE — ED Provider Notes (Signed)
[facility]-EMERGENCY DEPT Provider Note   CSN: [id number] Arrival date & time: 12/02/20  1403     History Chief Complaint  Patient presents with  . Alcohol Intoxication    Joseph Walters is a 50 y.o. male who presents via [employer] for evaluation after being found intoxicated with alcohol.  Patient has extensive history of alcohol abuse and intoxication.  Recently admitted for this issue.  At time of initial exam, patient is clearly extremely intoxicated.  Level 5 caveat due to patient's altered mental status secondary to inebriation on presentation.  Personally reviewed this patient's medical records.  Patient was recently admitted to behavioral health nursing unit at [facility] for alcohol use disorder.  I personally reviewed his medical records.  He also has history of renal stones.   HPI     Past Medical History:  Diagnosis Date  . Alcohol abuse   . Kidney stone   . Renal disorder     There are no problems to display for this patient.   Past Surgical History:  Procedure Laterality Date  . KIDNEY STONE SURGERY         History reviewed. No pertinent family history.  Social History   Tobacco Use  . Smoking status: Never Smoker  . Smokeless tobacco: Current User    Types: Snuff  Substance Use Topics  . Alcohol use: Yes    Comment: 1 pt of vodka a day or a 5th of wine a day  . Drug use: No    Home Medications Prior to Admission medications   Medication Sig Start Date End Date Taking? Authorizing Provider  dicyclomine (BENTYL) 20 MG tablet Take 1 tablet (20 mg total) by mouth 4 (four) times daily -  before meals and at bedtime for 10 days. 07/02/17 07/12/17  Cristina Gong, PA-C    Allergies    Penicillins  Review of Systems   Review of Systems  Unable to perform ROS: Other (Intoxicated)    Physical Exam Updated Vital Signs BP 95/64   Pulse 72   Temp 98.1 F (36.7 C) (Oral)   Resp 20   Ht 5\' 8"  (1.727 m)   Wt  72 kg   SpO2 93%   BMI 24.14 kg/m   Physical Exam Vitals and nursing note reviewed.  Constitutional:      General: He is sleeping. He is not in acute distress.    Appearance: He is normal weight. He is not ill-appearing or toxic-appearing.  HENT:     Head: Normocephalic.     Comments: No step-offs or hematomas, no bruising or signs of trauma aside from a very small abrasion to the bridge of the nose.     Nose: Nose normal.      Mouth/Throat:     Mouth: Mucous membranes are moist.     Pharynx: Oropharynx is clear. Uvula midline. No oropharyngeal exudate, posterior oropharyngeal erythema or uvula swelling.     Tonsils: No tonsillar exudate.  Eyes:     General: Lids are normal. Vision grossly intact.        Right eye: No discharge.        Left eye: No discharge.     Extraocular Movements: Extraocular movements intact.     Conjunctiva/sclera: Conjunctivae normal.     Pupils: Pupils are equal, round, and reactive to light.  Neck:     Trachea: Trachea normal.  Cardiovascular:     Rate and Rhythm: Normal rate and regular rhythm.  Pulses: Normal pulses.     Heart sounds: Normal heart sounds. No murmur heard.   Pulmonary:     Effort: Pulmonary effort is normal. No tachypnea, bradypnea, accessory muscle usage, prolonged expiration or respiratory distress.     Breath sounds: Normal breath sounds. No wheezing or rales.  Chest:     Chest wall: No mass, lacerations, deformity, swelling, tenderness, crepitus or edema.  Abdominal:     General: Bowel sounds are normal. There is no distension.     Palpations: Abdomen is soft.     Tenderness: There is no abdominal tenderness. There is no right CVA tenderness, left CVA tenderness, guarding or rebound.  Musculoskeletal:        General: No deformity.     Cervical back: Normal range of motion and neck supple. No edema, rigidity or crepitus. No pain with movement, spinous process tenderness or muscular tenderness.     Right lower leg: No  edema.     Left lower leg: No edema.  Lymphadenopathy:     Cervical: No cervical adenopathy.  Skin:    General: Skin is warm and dry.  Neurological:     Mental Status: He is oriented to person, place, and time and easily aroused. Mental status is at baseline.     Cranial Nerves: Cranial nerves are intact.     Sensory: Sensation is intact.     Motor: Motor function is intact.     Gait: Gait is intact.     Comments: Patient initially severely intoxicated, sleeping, but arousable to physical stimulus, nonpainful.  Slurring his speech initially but moving all 4 extremities spontaneously, does not appear to be favoring one side.  Normal vital signs at this time.  Will allow patient to metabolize alcohol and reevaluate.  Patient reevaluated after sleeping for 2 hours.  Maintained normal vital signs throughout, arousable, able to tell me he drinks 1 whole pint of liquor this morning, and is not in any pain.  Continues to be fairly somnolent, will allow to sleep some more and reevaluate.  Psychiatric:        Mood and Affect: Mood normal.     ED Results / Procedures / Treatments   Labs (all labs ordered are listed, but only abnormal results are displayed) Labs Reviewed - No data to display  EKG None  Radiology No results found.  Procedures Procedures   Medications Ordered in ED Medications - No data to display  ED Course  I have reviewed the triage vital signs and the nursing notes.  Pertinent labs & imaging results that were available during my care of the patient were reviewed by me and considered in my medical decision making (see chart for details).    MDM Rules/Calculators/A&P                         50 year old male presents with concern for alcohol intoxication.  Differential diagnose includes is not limited to intoxication or drug induced altered mental status, metabolic derangement, or trauma.  Vital signs are normal on intake.  Physical exam reassuring.  Head is  normocephalic and atraumatic with exception of small abrasion to the bridge of the nose.  Pupils are equal, round, and reactive to light.  Patient is moving all 4 extremities.  Clearly intoxicated, will allow to metabolize alcohol and reevaluate.  Patient reevaluated, now conversant and able to endorse alcohol usage.  Denies any recent traumas or falls, denies pain at this time.  Still  fairly somnolent will allow to metabolize further and reevaluate.  Patient would like to metabolize to freedom.  I was informed by the RN that this patient has eloped from the emergency department this time, was seen by staff walking from the triage room to the ED exit, ambulating normally and independently.  Told staff on the way out that he "feels much better and is ready to go". Patient has eloped from the ED at this time.  May return to the emergency department anytime he needs further evaluation.  This chart was dictated using voice recognition software, Dragon. Despite the best efforts of this provider to proofread and correct errors, errors may still occur which can change documentation meaning.  Final Clinical Impression(s) / ED Diagnoses Final diagnoses:  None    Rx / DC Orders ED Discharge Orders    None       Sherrilee Gilles 12/02/20 1826    Charlynne Pander, MD 12/02/20 2227

## 2020-12-02 NOTE — ED Notes (Signed)
Patient was observed by staff ambulating how the door saying he felt better and was leaving. PA made aware

## 2020-12-02 NOTE — ED Triage Notes (Signed)
Patient brought in by police due to ETOH intoxication.

## 2021-09-05 ENCOUNTER — Emergency Department (HOSPITAL_COMMUNITY)
Admission: EM | Admit: 2021-09-05 | Discharge: 2021-09-05 | Disposition: A | Discharge status: Home / Self Care | Payer: Self-pay | Attending: Emergency Medicine | Admitting: Emergency Medicine

## 2021-09-05 ENCOUNTER — Encounter (HOSPITAL_COMMUNITY): Payer: Self-pay | Admitting: Emergency Medicine

## 2021-09-05 DIAGNOSIS — Z5321 Procedure and treatment not carried out due to patient leaving prior to being seen by health care provider: Secondary | ICD-10-CM | POA: Diagnosis not present

## 2021-09-05 DIAGNOSIS — F10929 Alcohol use, unspecified with intoxication, unspecified: Secondary | ICD-10-CM | POA: Diagnosis not present

## 2021-09-05 NOTE — ED Notes (Signed)
Pt states, "I don't think my problem is that bad, I'm gonna leave."  Pt ambulatory out of department.  Will mark as LWBS

## 2021-09-05 NOTE — ED Triage Notes (Signed)
Per EMS-was outside of an apartment complex-patient is intoxicated-[employer] gave him the choice of going to jail or coming to the ED

## 2021-09-07 LAB — COLOGUARD

## 2021-10-27 ENCOUNTER — Other Ambulatory Visit: Payer: Self-pay

## 2021-10-27 ENCOUNTER — Emergency Department (HOSPITAL_COMMUNITY): Payer: 59

## 2021-10-27 ENCOUNTER — Encounter (HOSPITAL_COMMUNITY): Payer: Self-pay

## 2021-10-27 ENCOUNTER — Emergency Department (HOSPITAL_COMMUNITY)
Admission: EM | Admit: 2021-10-27 | Discharge: 2021-10-28 | Disposition: A | Discharge status: Home / Self Care | Payer: 59 | Attending: Emergency Medicine | Admitting: Emergency Medicine

## 2021-10-27 DIAGNOSIS — F10129 Alcohol abuse with intoxication, unspecified: Secondary | ICD-10-CM | POA: Insufficient documentation

## 2021-10-27 DIAGNOSIS — R079 Chest pain, unspecified: Secondary | ICD-10-CM | POA: Diagnosis not present

## 2021-10-27 DIAGNOSIS — Z5321 Procedure and treatment not carried out due to patient leaving prior to being seen by health care provider: Secondary | ICD-10-CM | POA: Diagnosis not present

## 2021-10-27 DIAGNOSIS — R0602 Shortness of breath: Secondary | ICD-10-CM | POA: Diagnosis not present

## 2021-10-27 DIAGNOSIS — F419 Anxiety disorder, unspecified: Secondary | ICD-10-CM | POA: Diagnosis not present

## 2021-10-27 NOTE — ED Provider Triage Note (Signed)
Emergency Medicine Provider Triage Evaluation Note ? ?Joseph Walters , a 51 y.o. male  was evaluated in triage.  Pt complains of CP and SOB. States that he has a history of ETOH abuse. 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 Last drank about 3 hours PTA. Reports CP, SOB and anxiety. ? ?Physical Exam  ?BP 137/90 (BP Location: Left Arm)   Pulse 71   Temp 97.9 ?F (36.6 ?C) (Oral)   Resp 20   Ht 5\' 8"  (1.727 m)   Wt 72 kg   SpO2 100%   BMI 24.14 kg/m?  ?Gen:   Awake, no distress   ?Resp:  Normal effort  ?MSK:   Moves extremities without difficulty  ?Other:   ? ?Medical Decision Making  ?Medically screening exam initiated at 11:36 PM.  Appropriate orders placed.  Joseph Walters was informed that the remainder of the evaluation will be completed by another provider, this initial triage assessment does not replace that evaluation, and the importance of remaining in the ED until their evaluation is complete. ?  ?Joseph Sexton, PA-C ?10/27/21 2338 ? ?

## 2021-10-27 NOTE — ED Triage Notes (Signed)
Pt presents to ED from home, states he quit drinking x 1 week ago and today had a relapse. Pt endorses last drink 2-3 hours ago, states he would like help getting into a detox program.  ?

## 2021-10-28 ENCOUNTER — Encounter (HOSPITAL_COMMUNITY): Payer: Self-pay

## 2021-10-28 ENCOUNTER — Emergency Department (HOSPITAL_COMMUNITY)
Admission: EM | Admit: 2021-10-28 | Discharge: 2021-10-28 | Disposition: A | Discharge status: Home / Self Care | Payer: 59 | Source: Home / Self Care | Attending: Emergency Medicine | Admitting: Emergency Medicine

## 2021-10-28 DIAGNOSIS — F1010 Alcohol abuse, uncomplicated: Secondary | ICD-10-CM

## 2021-10-28 DIAGNOSIS — F419 Anxiety disorder, unspecified: Secondary | ICD-10-CM | POA: Insufficient documentation

## 2021-10-28 DIAGNOSIS — Y909 Presence of alcohol in blood, level not specified: Secondary | ICD-10-CM | POA: Insufficient documentation

## 2021-10-28 MED ORDER — LORAZEPAM 1 MG PO TABS
1.0000 mg | ORAL_TABLET | Freq: Once | ORAL | Status: AC
Start: 2021-10-28 — End: 2021-10-28
  Administered 2021-10-28: 1 mg via ORAL
  Filled 2021-10-28: qty 1

## 2021-10-28 NOTE — ED Triage Notes (Signed)
Pt seen in ED earlier, states he left because he needed to go get some sleep. Pt presents to ED states he is having anxiety and would like help getting into a detox program.  ?

## 2021-10-28 NOTE — ED Provider Notes (Signed)
?[facility]-EMERGENCY DEPT ?Provider Note ? ? ?CSN: [id number] ?Arrival date & time: 10/28/21  0545 ? ?  ? ?History ? ?Chief Complaint  ?Patient presents with  ? Anxiety  ? ? ?Joseph Walters is a 51 y.o. male.  Patient presents with chief concern of wanting help with detox from alcohol with a secondary concern of anxiety as he is out of his anxiety medication. The patient states he had been sober for a period of time but lost his job recently and began drinking again. He is scheduled for rehab beginning Monday evening but was wanting an earlier option.  He currently has food insecurity with his food stamps being discontinued. He states that he has been feeling anxious because of job loss, because of a missing paycheck, and because of his parents being evicted. He denies SI. PMH significant for alcohol abuse.  ? ?HPI ? ?  ? ?Home Medications ?Prior to Admission medications   ?Medication Sig Start Date End Date Taking? Authorizing Provider  ?dicyclomine (BENTYL) 20 MG tablet Take 1 tablet (20 mg total) by mouth 4 (four) times daily -  before meals and at bedtime for 10 days. 07/02/17 07/12/17  Cristina Gong, PA-C  ?   ? ?Allergies    ?Penicillins   ? ?Review of Systems   ?Review of Systems  ?Psychiatric/Behavioral:  The patient is nervous/anxious.   ? ?Physical Exam ?Updated Vital Signs ?BP 130/82 (BP Location: Left Arm)   Pulse 85   Temp 98.5 ?F (36.9 ?C) (Oral)   Resp 16   SpO2 100%  ?Physical Exam ?Vitals and nursing note reviewed.  ?Constitutional:   ?   General: He is not in acute distress. ?HENT:  ?   Head: Normocephalic and atraumatic.  ?   Mouth/Throat:  ?   Mouth: Mucous membranes are moist.  ?Eyes:  ?   Conjunctiva/sclera: Conjunctivae normal.  ?Cardiovascular:  ?   Rate and Rhythm: Normal rate and regular rhythm.  ?   Pulses: Normal pulses.  ?Pulmonary:  ?   Effort: Pulmonary effort is normal.  ?   Breath sounds: Normal breath sounds.  ?Abdominal:  ?   Palpations: Abdomen is  soft.  ?Musculoskeletal:  ?   Cervical back: Normal range of motion and neck supple.  ?Skin: ?   General: Skin is warm and dry.  ?Neurological:  ?   Mental Status: He is alert.  ? ? ?ED Results / Procedures / Treatments   ?Labs ?(all labs ordered are listed, but only abnormal results are displayed) ?Labs Reviewed - No data to display ? ?EKG ?None ? ?Radiology ?DG Chest 2 View ? ?Result Date: 10/27/2021 ?CLINICAL DATA:  Chest pain, shortness of breath. EXAM: CHEST - 2 VIEW COMPARISON:  None. FINDINGS: The heart size and mediastinal contours are within normal limits. Both lungs are clear. No acute osseous abnormality. IMPRESSION: No active cardiopulmonary disease. Electronically Signed   By: Thornell Sartorius M.D.   On: 10/27/2021 23:50   ? ?Procedures ?Procedures  ? ? ?Medications Ordered in ED ?Medications  ?LORazepam (ATIVAN) tablet 1 mg (1 mg Oral Given 10/28/21 1213)  ? ? ?ED Course/ Medical Decision Making/ A&P ?  ?                        ?Medical Decision Making ?Risk ?Prescription drug management. ? ? ?The patient presents with a chief concern of wanting alcohol detoxification. I explained to the patient that we were not able  to offer a detox service in the emergency department but that we are able to provide outpatient resources. The patient was thankful for any available resource information.  I gave the patient signs of withdrawal which would necessitate return to the emergency department.  ? ?The patient had anxiety due to multiple social issues including job loss, food insecurity, housing insecurity, and others. I ordered an Ativan. The patient's anxiety had improved upon reassessment. The patient has a Klonopin prescription that will be filled on Monday.  ? ?There is no indication at this time for admission. I will discharge the patient home with outpatient resource information.  ? ? ?Final Clinical Impression(s) / ED Diagnoses ?Final diagnoses:  ?Anxiety  ?Alcohol abuse  ? ? ?Rx / DC Orders ?ED Discharge  Orders   ? ? None  ? ?  ? ? ?  ?Darrick Grinder, PA-C ?10/28/21 1249 ? ?  ?Wynetta Fines, MD ?10/28/21 1427 ? ?

## 2021-10-28 NOTE — Discharge Instructions (Addendum)
You were seen today requesting resources for alcohol detox and for anxiety. I have provided resources for outpatient substance abuse treatment. I recommend filling your anxiety prescription on Monday as discussed. Return if you develop signs or symptoms of withdrawal ?

## 2021-10-28 NOTE — ED Notes (Signed)
I call patient name in the lobby and outside.  Had a male employee call his name in the bathroom and no one responded. ?

## 2021-10-28 NOTE — ED Notes (Signed)
Pt is refused blood work  ?

## 2021-10-28 NOTE — ED Notes (Signed)
Sandwich and juice provided.

## 2022-09-08 ENCOUNTER — Emergency Department (HOSPITAL_COMMUNITY)
Admission: EM | Admit: 2022-09-08 | Discharge: 2022-09-09 | Disposition: A | Discharge status: Home / Self Care | Payer: 59 | Attending: Emergency Medicine | Admitting: Emergency Medicine

## 2022-09-08 ENCOUNTER — Other Ambulatory Visit: Payer: Self-pay

## 2022-09-08 DIAGNOSIS — E876 Hypokalemia: Secondary | ICD-10-CM | POA: Insufficient documentation

## 2022-09-08 DIAGNOSIS — R251 Tremor, unspecified: Secondary | ICD-10-CM | POA: Diagnosis present

## 2022-09-08 DIAGNOSIS — I10 Essential (primary) hypertension: Secondary | ICD-10-CM | POA: Diagnosis not present

## 2022-09-08 DIAGNOSIS — F1090 Alcohol use, unspecified, uncomplicated: Secondary | ICD-10-CM

## 2022-09-08 DIAGNOSIS — F1020 Alcohol dependence, uncomplicated: Secondary | ICD-10-CM | POA: Diagnosis not present

## 2022-09-08 DIAGNOSIS — Z79899 Other long term (current) drug therapy: Secondary | ICD-10-CM | POA: Insufficient documentation

## 2022-09-08 NOTE — ED Notes (Signed)
NA x1

## 2022-09-08 NOTE — ED Triage Notes (Signed)
Pt reports recently being taken off of klonopin by his doctor.  States he is having "withdrawal symptoms" from stopping that.

## 2022-09-09 LAB — CBC WITH DIFFERENTIAL/PLATELET
Abs Immature Granulocytes: 0.01 10*3/uL (ref 0.00–0.07)
Basophils Absolute: 0 10*3/uL (ref 0.0–0.1)
Basophils Relative: 1 %
Eosinophils Absolute: 0.1 10*3/uL (ref 0.0–0.5)
Eosinophils Relative: 1 %
HCT: 39.5 % (ref 39.0–52.0)
Hemoglobin: 13.3 g/dL (ref 13.0–17.0)
Immature Granulocytes: 0 %
Lymphocytes Relative: 32 %
Lymphs Abs: 2.4 10*3/uL (ref 0.7–4.0)
MCH: 32.4 pg (ref 26.0–34.0)
MCHC: 33.7 g/dL (ref 30.0–36.0)
MCV: 96.1 fL (ref 80.0–100.0)
Monocytes Absolute: 0.4 10*3/uL (ref 0.1–1.0)
Monocytes Relative: 6 %
Neutro Abs: 4.6 10*3/uL (ref 1.7–7.7)
Neutrophils Relative %: 60 %
Platelets: 308 10*3/uL (ref 150–400)
RBC: 4.11 MIL/uL — ABNORMAL LOW (ref 4.22–5.81)
RDW: 14 % (ref 11.5–15.5)
WBC: 7.5 10*3/uL (ref 4.0–10.5)
nRBC: 0 % (ref 0.0–0.2)

## 2022-09-09 LAB — COMPREHENSIVE METABOLIC PANEL
ALT: 20 U/L (ref 0–44)
AST: 30 U/L (ref 15–41)
Albumin: 4.5 g/dL (ref 3.5–5.0)
Alkaline Phosphatase: 59 U/L (ref 38–126)
Anion gap: 12 (ref 5–15)
BUN: 12 mg/dL (ref 6–20)
CO2: 25 mmol/L (ref 22–32)
Calcium: 8.8 mg/dL — ABNORMAL LOW (ref 8.9–10.3)
Chloride: 101 mmol/L (ref 98–111)
Creatinine, Ser: 0.74 mg/dL (ref 0.61–1.24)
GFR, Estimated: >60 mL/min (ref >60)
Glucose, Bld: 85 mg/dL (ref 70–99)
Potassium: 3.3 mmol/L — ABNORMAL LOW (ref 3.5–5.1)
Sodium: 138 mmol/L (ref 135–145)
Total Bilirubin: 0.6 mg/dL (ref 0.3–1.2)
Total Protein: 6.7 g/dL (ref 6.5–8.1)

## 2022-09-09 LAB — MAGNESIUM: Magnesium: 2.3 mg/dL (ref 1.7–2.4)

## 2022-09-09 MED ORDER — HYDROXYZINE HCL 25 MG PO TABS: 25.0000 mg | ORAL_TABLET | Freq: Three times a day (TID) | ORAL | 0 refills | Status: DC | PRN

## 2022-09-09 MED ORDER — HYDROXYZINE HCL 25 MG PO TABS
25.0000 mg | ORAL_TABLET | Freq: Once | ORAL | Status: AC
  Administered 2022-09-09: 25 mg via ORAL
  Filled 2022-09-09: qty 1

## 2022-09-09 MED ORDER — POTASSIUM CHLORIDE CRYS ER 20 MEQ PO TBCR
20.0000 meq | EXTENDED_RELEASE_TABLET | Freq: Once | ORAL | Status: AC
  Administered 2022-09-09: 20 meq via ORAL
  Filled 2022-09-09: qty 1

## 2022-09-09 NOTE — ED Provider Notes (Signed)
Joseph Walters   CSN: [id number] Arrival date & time: 09/08/22  2046     History  Chief Complaint  Patient presents with   Shaking    Joseph Walters is a 52 y.o. male.  The history is provided by the patient.  Joseph Walters is a 52 y.o. male who presents to the Emergency Department complaining of shaking.  He presents to the emergency department for evaluation of feeling shaky.  He states that this is an overall feeling of feeling shaky and unwell.  He states that his doctor stopped his Klonopin because they smelled alcohol on his breath.  His last dose of Klonopin was 3 days ago.  He states that it was prescribed 3 times daily but he was only taking 1 a day at times.  He was using it to help him sleep.  He also drinks about a pint of alcohol daily.  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  His last drink was this morning.  He feels anxious but has no nausea, vomiting, diarrhea, chest pain.  He has a history of hypertension and takes atenolol once daily.  No SI, HI.  He has been in rehab previously.  No history of alcohol withdrawal.   Home Medications Prior to Admission medications   Medication Sig Start Date End Date Taking? Authorizing Provider  amoxicillin-clavulanate (AUGMENTIN) 875-125 MG tablet Take 1 tablet by mouth 2 (two) times daily. 09/03/22  Yes [provider]  chlorhexidine (PERIDEX) 0.12 % solution Use as directed 15 mLs in the mouth or throat 2 (two) times daily. 09/03/22  Yes [provider]  hydrOXYzine (ATARAX) 25 MG tablet Take 1 tablet (25 mg total) by mouth every 8 (eight) hours as needed. 09/09/22  Yes Quintella Reichert, MD  traZODone (DESYREL) 50 MG tablet Take 50 mg by mouth at bedtime. 09/01/22  Yes [provider]  dicyclomine (BENTYL) 20 MG tablet Take 1 tablet (20 mg total) by mouth 4 (four) times daily -  before meals and at bedtime for 10 days.  07/02/17 07/12/17  Lorin Glass, PA-C      Allergies    Penicillins    Review of Systems   Review of Systems  All other systems reviewed and are negative.   Physical Exam Updated Vital Signs BP (!) 147/84 (BP Location: Right Arm)   Pulse 84   Temp 97.6 F (36.4 C) (Oral)   Resp 18   Ht '5\' 7"'$  (1.702 m)   Wt 72.6 kg   SpO2 97%   BMI 25.06 kg/m  Physical Exam Vitals and nursing Walters reviewed.  Constitutional:      Appearance: He is well-developed.  HENT:     Head: Normocephalic and atraumatic.  Cardiovascular:     Rate and Rhythm: Normal rate and regular rhythm.     Heart sounds: No murmur heard. Pulmonary:     Effort: Pulmonary effort is normal. No respiratory distress.     Breath sounds: Normal breath sounds.  Abdominal:     Palpations: Abdomen is soft.     Tenderness: There is no abdominal tenderness. There is no guarding or rebound.  Musculoskeletal:        General: No tenderness.  Skin:    General: Skin is warm and dry.  Neurological:     Mental Status: He is alert and oriented to person, place, and time.     Comments: Moves all  extremities symmetrically without tremor on resting or intention  Psychiatric:        Behavior: Behavior normal.     ED Results / Procedures / Treatments   Labs (all labs ordered are listed, but only abnormal results are displayed) Labs Reviewed  COMPREHENSIVE METABOLIC PANEL - Abnormal; Notable for the following components:      Result Value   Potassium 3.3 (*)    Calcium 8.8 (*)    All other components within normal limits  CBC WITH DIFFERENTIAL/PLATELET - Abnormal; Notable for the following components:   RBC 4.11 (*)    All other components within normal limits  MAGNESIUM    EKG None  Radiology No results found.  Procedures Procedures    Medications Ordered in ED Medications  potassium chloride SA (KLOR-CON M) CR tablet 20 mEq (has no administration in time range)  hydrOXYzine (ATARAX) tablet 25 mg  (has no administration in time range)    ED Course/ Medical Decision Making/ A&P                             Medical Decision Making Amount and/or Complexity of Data Reviewed Labs: ordered.  Risk Prescription drug management.   Patient with history of hypertension, alcohol abuse here for evaluation of feeling shaky.  He states that he ran out of Klonopin several days ago and was using this once daily.  He does continue to drink and his last drink was this morning.  He does want help getting off of alcohol but is not sure if he wants to fully stop drinking.  He denies any SI, HI.  No evidence of active withdrawal in the emergency department.  Labs significant for hypokalemia with potassium of 3.3.  This was replaced orally.  Offered patient resources for rehab.  Will prescribe hydroxyzine as needed anxiety.  Offered additional outpatient resources for behavioral health regarding his anxiety.        Final Clinical Impression(s) / ED Diagnoses Final diagnoses:  Alcohol use disorder  Hypokalemia    Rx / DC Orders ED Discharge Orders          Ordered    hydrOXYzine (ATARAX) 25 MG tablet  Every 8 hours PRN        09/09/22 0202              Quintella Reichert, MD 09/09/22 [phone]

## 2022-10-15 ENCOUNTER — Emergency Department (HOSPITAL_COMMUNITY)
Admission: EM | Admit: 2022-10-15 | Discharge: 2022-10-16 | Disposition: A | Discharge status: Home / Self Care | Payer: Medicaid Other | Attending: Emergency Medicine | Admitting: Emergency Medicine

## 2022-10-15 ENCOUNTER — Encounter (HOSPITAL_COMMUNITY): Payer: Self-pay | Admitting: Emergency Medicine

## 2022-10-15 ENCOUNTER — Other Ambulatory Visit: Payer: Self-pay

## 2022-10-15 DIAGNOSIS — F1010 Alcohol abuse, uncomplicated: Secondary | ICD-10-CM

## 2022-10-15 DIAGNOSIS — Y908 Blood alcohol level of 240 mg/100 ml or more: Secondary | ICD-10-CM | POA: Diagnosis not present

## 2022-10-15 DIAGNOSIS — R45851 Suicidal ideations: Secondary | ICD-10-CM | POA: Diagnosis not present

## 2022-10-15 LAB — COMPREHENSIVE METABOLIC PANEL
ALT: 52 U/L — ABNORMAL HIGH (ref 0–44)
AST: 33 U/L (ref 15–41)
Albumin: 4.5 g/dL (ref 3.5–5.0)
Alkaline Phosphatase: 50 U/L (ref 38–126)
Anion gap: 11 (ref 5–15)
BUN: 12 mg/dL (ref 6–20)
CO2: 25 mmol/L (ref 22–32)
Calcium: 8.7 mg/dL — ABNORMAL LOW (ref 8.9–10.3)
Chloride: 107 mmol/L (ref 98–111)
Creatinine, Ser: 0.8 mg/dL (ref 0.61–1.24)
GFR, Estimated: >60 mL/min (ref >60)
Glucose, Bld: 90 mg/dL (ref 70–99)
Potassium: 3.7 mmol/L (ref 3.5–5.1)
Sodium: 143 mmol/L (ref 135–145)
Total Bilirubin: 0.5 mg/dL (ref 0.3–1.2)
Total Protein: 7.4 g/dL (ref 6.5–8.1)

## 2022-10-15 LAB — ETHANOL: Alcohol, Ethyl (B): 376 mg/dL (ref <10)

## 2022-10-15 LAB — ACETAMINOPHEN LEVEL: Acetaminophen (Tylenol), Serum: <10 ug/mL — ABNORMAL LOW (ref 10–30)

## 2022-10-15 LAB — RAPID URINE DRUG SCREEN, HOSP PERFORMED
Amphetamines: NOT DETECTED
Barbiturates: NOT DETECTED
Benzodiazepines: NOT DETECTED
Cocaine: NOT DETECTED
Opiates: NOT DETECTED
Tetrahydrocannabinol: NOT DETECTED

## 2022-10-15 LAB — CBC
HCT: 39.6 % (ref 39.0–52.0)
Hemoglobin: 13.5 g/dL (ref 13.0–17.0)
MCH: 33.8 pg (ref 26.0–34.0)
MCHC: 34.1 g/dL (ref 30.0–36.0)
MCV: 99.2 fL (ref 80.0–100.0)
Platelets: 424 10*3/uL — ABNORMAL HIGH (ref 150–400)
RBC: 3.99 MIL/uL — ABNORMAL LOW (ref 4.22–5.81)
RDW: 14.3 % (ref 11.5–15.5)
WBC: 5.4 10*3/uL (ref 4.0–10.5)
nRBC: 0 % (ref 0.0–0.2)

## 2022-10-15 LAB — SALICYLATE LEVEL: Salicylate Lvl: <7 mg/dL — ABNORMAL LOW (ref 7.0–30.0)

## 2022-10-15 NOTE — ED Notes (Signed)
Patient changed out into scrubs. Belongings placed in patient bags behind nursing station.  Patient has 3 bags of belongings. Belongings: Shoes, socks, pants, shirt, wallet, tote bag, bag of tools, change.

## 2022-10-15 NOTE — ED Triage Notes (Signed)
Pt in from home via [facility], 911 called by parents for concern over increasing depression since his nephew killed himself yesterday. Hx of ETOH abuse, reports he drank a fifth of vodka today. Currently +SI, denies any HI, AH/VH. Pt lethargic, wakes and responds to name and answers all questions.  VS en route: 118/48 70HR 16RR 96%RA CBG 89 GCS 14

## 2022-10-15 NOTE — ED Notes (Signed)
Patient wanded by Security  

## 2022-10-15 NOTE — ED Provider Notes (Signed)
[place] EMERGENCY DEPARTMENT AT [facility] Provider Note   CSN: [id number] Arrival date & time: 10/15/22  1906     History  Chief Complaint  Patient presents with   Alcohol Intoxication   Suicidal    Joseph Walters is a 52 y.o. male.  52 year old male presents with intoxication as well as suicidal ideations.  Patient states that he has history of depression and did attempt to take atenolol yesterday in a suicide attempt.  Patient admits to daily drinking and drink 1/5 of vodka yesterday.  Denies any other coingestions at this time.  Has had no abdominal pain or emesis.  EMS was called and patient transported here       Home Medications Prior to Admission medications   Medication Sig Start Date End Date Taking? Authorizing Provider  amoxicillin-clavulanate (AUGMENTIN) 875-125 MG tablet Take 1 tablet by mouth 2 (two) times daily. 09/03/22   [provider]  chlorhexidine (PERIDEX) 0.12 % solution Use as directed 15 mLs in the mouth or throat 2 (two) times daily. 09/03/22   [provider]  dicyclomine (BENTYL) 20 MG tablet Take 1 tablet (20 mg total) by mouth 4 (four) times daily -  before meals and at bedtime for 10 days. 07/02/17 07/12/17  Lorin Glass, PA-C  hydrOXYzine (ATARAX) 25 MG tablet Take 1 tablet (25 mg total) by mouth every 8 (eight) hours as needed. 09/09/22   Quintella Reichert, MD  traZODone (DESYREL) 50 MG tablet Take 50 mg by mouth at bedtime. 09/01/22   [provider]      Allergies    Penicillins    Review of Systems   Review of Systems  All other systems reviewed and are negative.   Physical Exam Updated Vital Signs BP 110/65 (BP Location: Right Arm)   Pulse 71   Temp 98.6 F (37 C) (Oral)   Resp 17   Wt 72.6 kg   SpO2 99%   BMI 25.07 kg/m  Physical Exam Vitals and nursing note reviewed.  Constitutional:      General: He is not in acute distress.    Appearance: Normal appearance. He is  well-developed. He is not toxic-appearing.  HENT:     Head: Normocephalic and atraumatic.  Eyes:     General: Lids are normal.     Conjunctiva/sclera: Conjunctivae normal.     Pupils: Pupils are equal, round, and reactive to light.  Neck:     Thyroid: No thyroid mass.     Trachea: No tracheal deviation.  Cardiovascular:     Rate and Rhythm: Normal rate and regular rhythm.     Heart sounds: Normal heart sounds. No murmur heard.    No gallop.  Pulmonary:     Effort: Pulmonary effort is normal. No respiratory distress.     Breath sounds: Normal breath sounds. No stridor. No decreased breath sounds, wheezing, rhonchi or rales.  Abdominal:     General: There is no distension.     Palpations: Abdomen is soft.     Tenderness: There is no abdominal tenderness. There is no rebound.  Musculoskeletal:        General: No tenderness. Normal range of motion.     Cervical back: Normal range of motion and neck supple.  Skin:    General: Skin is warm and dry.     Findings: No abrasion or rash.  Neurological:     Mental Status: He is alert and oriented to person, place, and time. Mental status is  at baseline.     GCS: GCS eye subscore is 4. GCS verbal subscore is 5. GCS motor subscore is 6.     Cranial Nerves: No cranial nerve deficit.     Sensory: No sensory deficit.     Motor: Motor function is intact.  Psychiatric:        Attention and Perception: He is inattentive.        Mood and Affect: Affect is flat.        Speech: Speech is slurred.        Behavior: Behavior is slowed and withdrawn.        Thought Content: Thought content includes suicidal ideation. Thought content includes suicidal plan.     ED Results / Procedures / Treatments   Labs (all labs ordered are listed, but only abnormal results are displayed) Labs Reviewed  COMPREHENSIVE METABOLIC PANEL - Abnormal; Notable for the following components:      Result Value   Calcium 8.7 (*)    ALT 52 (*)    All other components  within normal limits  CBC - Abnormal; Notable for the following components:   RBC 3.99 (*)    Platelets 424 (*)    All other components within normal limits  ETHANOL  RAPID URINE DRUG SCREEN, HOSP PERFORMED  ACETAMINOPHEN LEVEL  SALICYLATE LEVEL    EKG EKG Interpretation  Date/Time:  Monday October 15 2022 20:34:37 EDT Ventricular Rate:  75 PR Interval:  182 QRS Duration: 92 QT Interval:  402 QTC Calculation: 448 R Axis:   84 Text Interpretation: Normal sinus rhythm Normal ECG No previous ECGs available Confirmed by Lacretia Leigh ([id number]) on 10/15/2022 8:50:37 PM  Radiology No results found.  Procedures Procedures    Medications Ordered in ED Medications - No data to display  ED Course/ Medical Decision Making/ A&P                             Medical Decision Making Amount and/or Complexity of Data Reviewed Labs: ordered.  Patient is EKG shows normal sinus rhythm. Patient here with suicidal ideations and alcohol abuse.  Patient's alcohol level noted and he will need to metabolize before he will be able to speak with psychiatry.  Urine drug screen negative.  Will sign off next of either will need to consult TTS.        Final Clinical Impression(s) / ED Diagnoses Final diagnoses:  None    Rx / DC Orders ED Discharge Orders     None         Lacretia Leigh, MD 10/15/22 2229

## 2022-10-15 NOTE — ED Notes (Signed)
Lab called to add on acetaminophen and salicylate level.

## 2022-10-16 ENCOUNTER — Encounter (HOSPITAL_COMMUNITY): Payer: Self-pay

## 2022-10-16 NOTE — Discharge Instructions (Addendum)
Please return if any further medical problems. You can follow up with the behavioral  urgent care if you wish to access the resources regarding mental health or addiction
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
## 2022-10-16 NOTE — ED Notes (Signed)
Patient resting on stretcher drinking water. Patient verbalized no needs at this time

## 2022-11-11 ENCOUNTER — Emergency Department (HOSPITAL_COMMUNITY): Payer: Medicaid Other

## 2022-11-11 ENCOUNTER — Other Ambulatory Visit: Payer: Self-pay

## 2022-11-11 ENCOUNTER — Emergency Department (HOSPITAL_COMMUNITY)
Admission: EM | Admit: 2022-11-11 | Discharge: 2022-11-11 | Disposition: A | Discharge status: Home / Self Care | Payer: Medicaid Other | Attending: Emergency Medicine | Admitting: Emergency Medicine

## 2022-11-11 ENCOUNTER — Encounter (HOSPITAL_COMMUNITY): Payer: Self-pay | Admitting: *Deleted

## 2022-11-11 DIAGNOSIS — R1032 Left lower quadrant pain: Secondary | ICD-10-CM | POA: Insufficient documentation

## 2022-11-11 DIAGNOSIS — R21 Rash and other nonspecific skin eruption: Secondary | ICD-10-CM

## 2022-11-11 DIAGNOSIS — R252 Cramp and spasm: Secondary | ICD-10-CM | POA: Diagnosis not present

## 2022-11-11 DIAGNOSIS — R6884 Jaw pain: Secondary | ICD-10-CM | POA: Diagnosis not present

## 2022-11-11 DIAGNOSIS — K0889 Other specified disorders of teeth and supporting structures: Secondary | ICD-10-CM | POA: Diagnosis present

## 2022-11-11 DIAGNOSIS — E876 Hypokalemia: Secondary | ICD-10-CM | POA: Insufficient documentation

## 2022-11-11 LAB — COMPREHENSIVE METABOLIC PANEL
ALT: 37 U/L (ref 0–44)
AST: 26 U/L (ref 15–41)
Albumin: 4 g/dL (ref 3.5–5.0)
Alkaline Phosphatase: 57 U/L (ref 38–126)
Anion gap: 10 (ref 5–15)
BUN: 14 mg/dL (ref 6–20)
CO2: 26 mmol/L (ref 22–32)
Calcium: 9.5 mg/dL (ref 8.9–10.3)
Chloride: 98 mmol/L (ref 98–111)
Creatinine, Ser: 0.97 mg/dL (ref 0.61–1.24)
GFR, Estimated: >60 mL/min (ref >60)
Glucose, Bld: 114 mg/dL — ABNORMAL HIGH (ref 70–99)
Potassium: 3.4 mmol/L — ABNORMAL LOW (ref 3.5–5.1)
Sodium: 134 mmol/L — ABNORMAL LOW (ref 135–145)
Total Bilirubin: 1.1 mg/dL (ref 0.3–1.2)
Total Protein: 6.7 g/dL (ref 6.5–8.1)

## 2022-11-11 LAB — CBC
HCT: 36.2 % — ABNORMAL LOW (ref 39.0–52.0)
Hemoglobin: 12.8 g/dL — ABNORMAL LOW (ref 13.0–17.0)
MCH: 34.1 pg — ABNORMAL HIGH (ref 26.0–34.0)
MCHC: 35.4 g/dL (ref 30.0–36.0)
MCV: 96.5 fL (ref 80.0–100.0)
Platelets: 279 10*3/uL (ref 150–400)
RBC: 3.75 MIL/uL — ABNORMAL LOW (ref 4.22–5.81)
RDW: 13.1 % (ref 11.5–15.5)
WBC: 6 10*3/uL (ref 4.0–10.5)
nRBC: 0 % (ref 0.0–0.2)

## 2022-11-11 LAB — LIPASE, BLOOD: Lipase: 107 U/L — ABNORMAL HIGH (ref 11–51)

## 2022-11-11 LAB — LACTIC ACID, PLASMA
Lactic Acid, Venous: 1 mmol/L (ref 0.5–1.9)
Lactic Acid, Venous: 1.1 mmol/L (ref 0.5–1.9)

## 2022-11-11 LAB — URINALYSIS, ROUTINE W REFLEX MICROSCOPIC
Bilirubin Urine: NEGATIVE
Glucose, UA: NEGATIVE mg/dL
Hgb urine dipstick: NEGATIVE
Ketones, ur: NEGATIVE mg/dL
Leukocytes,Ua: NEGATIVE
Nitrite: NEGATIVE
Protein, ur: NEGATIVE mg/dL
Specific Gravity, Urine: 1.008 (ref 1.005–1.030)
pH: 6 (ref 5.0–8.0)

## 2022-11-11 MED ORDER — IBUPROFEN 400 MG PO TABS
400.0000 mg | ORAL_TABLET | Freq: Once | ORAL | Status: AC | PRN
  Administered 2022-11-11: 400 mg via ORAL
  Filled 2022-11-11: qty 1

## 2022-11-11 MED ORDER — PREDNISONE 10 MG (21) PO TBPK: ORAL_TABLET | Freq: Every day | ORAL | 0 refills | Status: DC

## 2022-11-11 MED ORDER — FENTANYL CITRATE PF 50 MCG/ML IJ SOSY
50.0000 ug | PREFILLED_SYRINGE | Freq: Once | INTRAMUSCULAR | Status: AC
  Administered 2022-11-11: 50 ug via INTRAVENOUS
  Filled 2022-11-11: qty 1

## 2022-11-11 MED ORDER — IBUPROFEN 600 MG PO TABS: 600.0000 mg | ORAL_TABLET | Freq: Four times a day (QID) | ORAL | 0 refills | Status: DC | PRN

## 2022-11-11 MED ORDER — TRIAMCINOLONE ACETONIDE 0.1 % EX CREA: 1.0000 | TOPICAL_CREAM | Freq: Two times a day (BID) | CUTANEOUS | 0 refills | Status: DC

## 2022-11-11 MED ORDER — IOHEXOL 350 MG/ML SOLN
75.0000 mL | Freq: Once | INTRAVENOUS | Status: AC | PRN
  Administered 2022-11-11: 75 mL via INTRAVENOUS

## 2022-11-11 NOTE — ED Provider Notes (Signed)
  Physical Exam  BP 122/86   Pulse 72   Temp 98.6 F (37 C)   Resp 18   Ht 5\' 7"  (1.702 m)   Wt 72.6 kg   SpO2 97%   BMI 25.07 kg/m   Physical Exam Vitals and nursing note reviewed.  Constitutional:      General: He is not in acute distress.    Appearance: Normal appearance.  HENT:     Head: Normocephalic and atraumatic.     Mouth/Throat:     Mouth: Mucous membranes are moist.     Comments: No trismus  Eyes:     Conjunctiva/sclera: Conjunctivae normal.  Cardiovascular:     Rate and Rhythm: Normal rate.  Pulmonary:     Effort: Pulmonary effort is normal. No respiratory distress.  Abdominal:     General: Abdomen is flat.  Musculoskeletal:     Cervical back: Normal range of motion and neck supple.  Skin:    General: Skin is warm and dry.     Capillary Refill: Capillary refill takes less than 2 seconds.     Findings: Rash (scattered erythematous pruritic appearing rash. no purlence or skin sloughing) present.  Neurological:     General: No focal deficit present.     Mental Status: He is alert. Mental status is at baseline.  Psychiatric:        Mood and Affect: Mood normal.        Behavior: Behavior normal.     Procedures  Procedures  ED Course / MDM   Clinical Course as of 11/11/22 1906  Sun Nov 11, 2022  1543 Received sign out from Dr. Rush Landmark pending CT neck and CT abdomen. Has jaw pain as well some left lower quadrant abdominal pain.  [WS]  1903 CT neck and CT abdomen negative for acute process. He does not appear to have trismus currently. Discussed results with the patient.  He also is complaining of itchy rash and requests a prednisone taper and triamcinolone.  He does have a scattered erythematous rash.  He reports that he has had this before and these medications have helped.  Will prescribe these. Will discharge patient to home. All questions answered. Patient comfortable with plan of discharge. Return precautions discussed with patient and specified on the  after visit summary.  [WS]    Clinical Course User Index [WS] Lonell Grandchild, MD   Medical Decision Making Amount and/or Complexity of Data Reviewed Labs: ordered. Radiology: ordered.  Risk Prescription drug management.          Lonell Grandchild, MD 11/11/22 [phone]

## 2022-11-11 NOTE — ED Provider Notes (Signed)
[place] EMERGENCY DEPARTMENT AT [facility] Provider Note   CSN: [id number] Arrival date & time: 11/11/22  1320     History  Chief Complaint  Patient presents with   Jaw Pain    Joseph Walters is a 52 y.o. male.  The history is provided by the patient and medical records. No language interpreter was used.  Abdominal Pain Pain location:  LLQ Pain quality: aching and dull   Pain radiates to:  Does not radiate Pain severity:  Severe Onset quality:  Gradual Duration:  1 week Progression:  Waxing and waning Chronicity:  New Relieved by:  Nothing Worsened by:  Nothing Ineffective treatments:  None tried Associated symptoms: no chest pain, no chills, no constipation, no cough, no diarrhea, no fatigue, no fever, no nausea and no vomiting   Dental Pain Location:  Lower (right posterior jaw pain) Quality:  Aching Severity:  Severe Onset quality:  Sudden Duration:  2 weeks Timing:  Constant Progression:  Unchanged Chronicity:  New Context: poor dentition   Associated symptoms: no congestion, no fever, no headaches and no neck pain        Home Medications Prior to Admission medications   Medication Sig Start Date End Date Taking? Authorizing Provider  amoxicillin-clavulanate (AUGMENTIN) 875-125 MG tablet Take 1 tablet by mouth 2 (two) times daily. 09/03/22   [provider]  chlorhexidine (PERIDEX) 0.12 % solution Use as directed 15 mLs in the mouth or throat 2 (two) times daily. 09/03/22   [provider]  dicyclomine (BENTYL) 20 MG tablet Take 1 tablet (20 mg total) by mouth 4 (four) times daily -  before meals and at bedtime for 10 days. 07/02/17 07/12/17  Cristina Gong, PA-C  hydrOXYzine (ATARAX) 25 MG tablet Take 1 tablet (25 mg total) by mouth every 8 (eight) hours as needed. 09/09/22   Tilden Fossa, MD  traZODone (DESYREL) 50 MG tablet Take 50 mg by mouth at bedtime. 09/01/22   [provider]      Allergies     Penicillins    Review of Systems   Review of Systems  Constitutional:  Negative for chills, fatigue and fever.  HENT:  Positive for dental problem and trouble swallowing. Negative for congestion and tinnitus.   Respiratory:  Negative for cough.   Cardiovascular:  Negative for chest pain, palpitations and leg swelling.  Gastrointestinal:  Positive for abdominal pain. Negative for constipation, diarrhea, nausea and vomiting.  Genitourinary:  Negative for flank pain.  Musculoskeletal:  Negative for back pain, neck pain and neck stiffness.  Skin:  Negative for rash and wound.  Neurological:  Negative for headaches.  Psychiatric/Behavioral:  Negative for agitation.   All other systems reviewed and are negative.   Physical Exam Updated Vital Signs BP 130/82 (BP Location: Right Arm)   Pulse 84   Temp 98.4 F (36.9 C) (Oral)   Resp 16   Ht 5\' 7"  (1.702 m)   Wt 72.6 kg   SpO2 98%   BMI 25.07 kg/m  Physical Exam Vitals and nursing note reviewed.  Constitutional:      General: He is not in acute distress.    Appearance: He is well-developed. He is not ill-appearing, toxic-appearing or diaphoretic.  HENT:     Head: Atraumatic.     Jaw: Trismus present.      Nose: No congestion or rhinorrhea.     Mouth/Throat:     Mouth: Mucous membranes are moist. No angioedema.     Tongue:  No lesions. Tongue does not deviate from midline.     Pharynx: No oropharyngeal exudate or posterior oropharyngeal erythema.  Eyes:     Extraocular Movements: Extraocular movements intact.     Conjunctiva/sclera: Conjunctivae normal.     Pupils: Pupils are equal, round, and reactive to light.  Cardiovascular:     Rate and Rhythm: Normal rate and regular rhythm.     Heart sounds: No murmur heard. Pulmonary:     Effort: Pulmonary effort is normal. No respiratory distress.     Breath sounds: Normal breath sounds. No stridor. No wheezing or rhonchi.  Chest:     Chest wall: No tenderness.  Abdominal:      General: Abdomen is flat.     Palpations: Abdomen is soft.     Tenderness: There is abdominal tenderness in the left lower quadrant. There is no right CVA tenderness, left CVA tenderness, guarding or rebound.    Musculoskeletal:        General: No swelling.     Cervical back: Neck supple. Tenderness present.  Skin:    General: Skin is warm and dry.     Capillary Refill: Capillary refill takes less than 2 seconds.  Neurological:     Mental Status: He is alert.  Psychiatric:        Mood and Affect: Mood normal.     ED Results / Procedures / Treatments   Labs (all labs ordered are listed, but only abnormal results are displayed) Labs Reviewed  LIPASE, BLOOD - Abnormal; Notable for the following components:      Result Value   Lipase 107 (*)    All other components within normal limits  COMPREHENSIVE METABOLIC PANEL - Abnormal; Notable for the following components:   Sodium 134 (*)    Potassium 3.4 (*)    Glucose, Bld 114 (*)    All other components within normal limits  CBC - Abnormal; Notable for the following components:   RBC 3.75 (*)    Hemoglobin 12.8 (*)    HCT 36.2 (*)    MCH 34.1 (*)    All other components within normal limits  URINALYSIS, ROUTINE W REFLEX MICROSCOPIC  LACTIC ACID, PLASMA  LACTIC ACID, PLASMA    EKG None  Radiology No results found.  Procedures Procedures    Medications Ordered in ED Medications  fentaNYL (SUBLIMAZE) injection 50 mcg (has no administration in time range)  ibuprofen (ADVIL) tablet 400 mg (400 mg Oral Given 11/11/22 1345)    ED Course/ Medical Decision Making/ A&P                             Medical Decision Making Amount and/or Complexity of Data Reviewed Labs: ordered.  Risk Prescription drug management.    Joseph Walters is a 52 y.o. male with a past medical history significant for previous kidney stone, previous jaw fracture status post repair many years ago, and poor dentition who presents with right jaw  pain for the last 2 weeks as well as left lower quadrant abdominal pain.  Patient reports that about 2 weeks ago he bit down on something hard that might be a chicken bone and he felt a crack and pop in his right lower jaw.  He reports it has been hurting and has had trismus.  He has been eating only soups and crackers because he cannot open his jaw very far.  He reports he is having some pain with swallowing  as well but is denying fevers or chills.  He denies any chest pain or shortness of breath but does he has had some chills.  He also reports that over the last week or so he developed left lower quadrant abdominal pain.  He denies nausea or vomiting but is complaining of left lower quadrant discomfort.  He denies constipation or diarrhea and denies any urinary.  Denies any history of hernias or diverticulitis but he has history of kidney stones.  He reports is not hurting his left back or flank like it has in the past.  Patient reports his pain is moderate to severe in both locations.  He presents for evaluation.  On exam, lungs clear.  Chest nontender.  Left lower abdomen is quite tender to palpation.  GU exam deferred initially as he was having GU symptoms.  No tenderness in the left flank or left CVA area.  No rash.  Bowel sounds were appreciated.  I do not appreciate stridor on exam but he was having tenderness under his right jaw as well as the right lower jaw.  He did have some trismus and had pain when I try to open his jaw.  He says he has had clicking in his jaw in the past but did not know of any true TMJ dislocation to his knowledge.  Oropharyngeal exam did not show evidence of PTA or RPA.  No swelling seen in the posterior oropharynx initially.  Due to this jaw popping and trismus, I do plan to get CT imaging to rule out dislocation, jaw fracture, or significant dental infection with abscess.  Will get jaw CT will also get CT abdomen pelvis given this possibility for diverticulitis as well.  Will  get screening labs and the imaging.  Will give pain medicine initially.  Care transferred to oncoming team to await reassessment after workup is completed.         Final Clinical Impression(s) / ED Diagnoses Final diagnoses:  Pain, dental  Jaw pain  Trismus  LLQ abdominal pain    Clinical Impression: 1. Pain, dental   2. Jaw pain   3. Trismus   4. LLQ abdominal pain     Disposition: Care transferred to oncoming team to await reassessment after CT imaging and lab workup.  This note was prepared with assistance of Conservation officer, historic buildings. Occasional wrong-word or sound-a-like substitutions may have occurred due to the inherent limitations of voice recognition software.     Kadarius Cuffe, Canary Brim, MD 11/11/22 1538

## 2022-11-11 NOTE — ED Notes (Signed)
Patient transported to CT 

## 2022-11-11 NOTE — ED Triage Notes (Signed)
Pt states hx of jaw surgery and dental work d/t fractured jaw.  States bit down on something and heard a crack in his R lower jaw.  Pain has been increasing.  Now feels like jaw is locking up.  Also c/o LLQ abd pain.  Denies changes in bowel or bladder habits.

## 2022-11-11 NOTE — Discharge Instructions (Addendum)
We evaluated you for your abdominal pain, jaw pain and rash.  Your CT scans did not show any dangerous process in your mouth or jaw or in your abdomen.  We also evaluated your rash.  Since it is itchy and you have had this before and prednisone does help we sent a prednisone pack.  Please follow-up with your primary doctor for further management.  Please return to the emergency department if you have any worsening symptoms or new symptoms such as fevers or chills, persistent vomiting, severe pain, trouble swallowing, trouble breathing, worsening of your rash, or any other concerning symptoms.

## 2022-11-11 NOTE — ED Notes (Signed)
Patient given a sandwich bag and ginger ale.

## 2022-12-13 ENCOUNTER — Emergency Department (HOSPITAL_COMMUNITY)
Admission: EM | Admit: 2022-12-13 | Discharge: 2022-12-13 | Disposition: A | Discharge status: Home / Self Care | Payer: Medicaid Other | Attending: Emergency Medicine | Admitting: Emergency Medicine

## 2022-12-13 ENCOUNTER — Encounter (HOSPITAL_COMMUNITY): Payer: Self-pay

## 2022-12-13 ENCOUNTER — Other Ambulatory Visit: Payer: Self-pay

## 2022-12-13 ENCOUNTER — Emergency Department (HOSPITAL_COMMUNITY): Payer: Medicaid Other

## 2022-12-13 DIAGNOSIS — R072 Precordial pain: Secondary | ICD-10-CM | POA: Insufficient documentation

## 2022-12-13 DIAGNOSIS — F10130 Alcohol abuse with withdrawal, uncomplicated: Secondary | ICD-10-CM | POA: Diagnosis not present

## 2022-12-13 DIAGNOSIS — R251 Tremor, unspecified: Secondary | ICD-10-CM | POA: Diagnosis not present

## 2022-12-13 DIAGNOSIS — Z7982 Long term (current) use of aspirin: Secondary | ICD-10-CM | POA: Insufficient documentation

## 2022-12-13 DIAGNOSIS — F10930 Alcohol use, unspecified with withdrawal, uncomplicated: Secondary | ICD-10-CM

## 2022-12-13 DIAGNOSIS — Y909 Presence of alcohol in blood, level not specified: Secondary | ICD-10-CM | POA: Insufficient documentation

## 2022-12-13 LAB — COMPREHENSIVE METABOLIC PANEL
ALT: 104 U/L — ABNORMAL HIGH (ref 0–44)
AST: 121 U/L — ABNORMAL HIGH (ref 15–41)
Albumin: 4.1 g/dL (ref 3.5–5.0)
Alkaline Phosphatase: 60 U/L (ref 38–126)
Anion gap: 13 (ref 5–15)
BUN: 11 mg/dL (ref 6–20)
CO2: 25 mmol/L (ref 22–32)
Calcium: 9.2 mg/dL (ref 8.9–10.3)
Chloride: 96 mmol/L — ABNORMAL LOW (ref 98–111)
Creatinine, Ser: 0.8 mg/dL (ref 0.61–1.24)
GFR, Estimated: >60 mL/min (ref >60)
Glucose, Bld: 76 mg/dL (ref 70–99)
Potassium: 3.2 mmol/L — ABNORMAL LOW (ref 3.5–5.1)
Sodium: 134 mmol/L — ABNORMAL LOW (ref 135–145)
Total Bilirubin: 1.7 mg/dL — ABNORMAL HIGH (ref 0.3–1.2)
Total Protein: 7.1 g/dL (ref 6.5–8.1)

## 2022-12-13 LAB — ETHANOL: Alcohol, Ethyl (B): <10 mg/dL (ref <10)

## 2022-12-13 LAB — CBC WITH DIFFERENTIAL/PLATELET
Abs Immature Granulocytes: 0.01 10*3/uL (ref 0.00–0.07)
Basophils Absolute: 0 10*3/uL (ref 0.0–0.1)
Basophils Relative: 0 %
Eosinophils Absolute: 0 10*3/uL (ref 0.0–0.5)
Eosinophils Relative: 1 %
HCT: 35.7 % — ABNORMAL LOW (ref 39.0–52.0)
Hemoglobin: 12.3 g/dL — ABNORMAL LOW (ref 13.0–17.0)
Immature Granulocytes: 0 %
Lymphocytes Relative: 25 %
Lymphs Abs: 0.9 10*3/uL (ref 0.7–4.0)
MCH: 33.9 pg (ref 26.0–34.0)
MCHC: 34.5 g/dL (ref 30.0–36.0)
MCV: 98.3 fL (ref 80.0–100.0)
Monocytes Absolute: 0.5 10*3/uL (ref 0.1–1.0)
Monocytes Relative: 12 %
Neutro Abs: 2.2 10*3/uL (ref 1.7–7.7)
Neutrophils Relative %: 62 %
Platelets: 106 10*3/uL — ABNORMAL LOW (ref 150–400)
RBC: 3.63 MIL/uL — ABNORMAL LOW (ref 4.22–5.81)
RDW: 13.6 % (ref 11.5–15.5)
WBC: 3.6 10*3/uL — ABNORMAL LOW (ref 4.0–10.5)
nRBC: 0 % (ref 0.0–0.2)

## 2022-12-13 LAB — SALICYLATE LEVEL: Salicylate Lvl: <7 mg/dL — ABNORMAL LOW (ref 7.0–30.0)

## 2022-12-13 LAB — RAPID URINE DRUG SCREEN, HOSP PERFORMED
Amphetamines: NOT DETECTED
Barbiturates: NOT DETECTED
Benzodiazepines: NOT DETECTED
Cocaine: NOT DETECTED
Opiates: NOT DETECTED
Tetrahydrocannabinol: NOT DETECTED

## 2022-12-13 LAB — LIPASE, BLOOD: Lipase: 75 U/L — ABNORMAL HIGH (ref 11–51)

## 2022-12-13 LAB — TROPONIN I (HIGH SENSITIVITY)
Troponin I (High Sensitivity): 9 ng/L (ref <18)
Troponin I (High Sensitivity): 10 ng/L (ref <18)

## 2022-12-13 LAB — ACETAMINOPHEN LEVEL: Acetaminophen (Tylenol), Serum: <10 ug/mL — ABNORMAL LOW (ref 10–30)

## 2022-12-13 MED ORDER — HYDROMORPHONE HCL 1 MG/ML IJ SOLN
0.5000 mg | Freq: Once | INTRAMUSCULAR | Status: AC
  Administered 2022-12-13: 0.5 mg via INTRAVENOUS
  Filled 2022-12-13: qty 1

## 2022-12-13 MED ORDER — LORAZEPAM 2 MG/ML IJ SOLN: 0.0000 mg | Freq: Two times a day (BID) | INTRAMUSCULAR | Status: DC

## 2022-12-13 MED ORDER — LORAZEPAM 1 MG PO TABS: 0.0000 mg | ORAL_TABLET | Freq: Four times a day (QID) | ORAL | Status: DC

## 2022-12-13 MED ORDER — LORAZEPAM 2 MG/ML IJ SOLN
0.0000 mg | Freq: Four times a day (QID) | INTRAMUSCULAR | Status: DC
  Administered 2022-12-13: 2 mg via INTRAVENOUS
  Filled 2022-12-13: qty 1

## 2022-12-13 MED ORDER — THIAMINE HCL 100 MG/ML IJ SOLN: 100.0000 mg | Freq: Every day | INTRAMUSCULAR | Status: DC

## 2022-12-13 MED ORDER — SODIUM CHLORIDE 0.9 % IV SOLN: INTRAVENOUS | Status: DC

## 2022-12-13 MED ORDER — LORAZEPAM 1 MG PO TABS: 0.0000 mg | ORAL_TABLET | Freq: Two times a day (BID) | ORAL | Status: DC

## 2022-12-13 MED ORDER — LORAZEPAM 1 MG PO TABS: 1.0000 mg | ORAL_TABLET | Freq: Three times a day (TID) | ORAL | 0 refills | Status: DC | PRN

## 2022-12-13 MED ORDER — THIAMINE MONONITRATE 100 MG PO TABS
100.0000 mg | ORAL_TABLET | Freq: Every day | ORAL | Status: DC
  Administered 2022-12-13: 100 mg via ORAL
  Filled 2022-12-13: qty 1

## 2022-12-13 MED ORDER — SODIUM CHLORIDE 0.9 % IV BOLUS
1000.0000 mL | Freq: Once | INTRAVENOUS | Status: AC
  Administered 2022-12-13: 1000 mL via INTRAVENOUS

## 2022-12-13 NOTE — ED Triage Notes (Signed)
Per EMS, pt coming from urgent care. Pt believes he is going through withdrawal. Pt also complaining of intermittent chest pain, given 324mg  of aspirin by EMS.

## 2022-12-13 NOTE — ED Provider Notes (Signed)
[place] EMERGENCY DEPARTMENT AT [facility] Provider Note   CSN: [id number] Arrival date & time: 12/13/22  1817     History  Chief Complaint  Patient presents with   Chest Pain    Joseph Walters is a 52 y.o. male.  Patient here with a complaint of substernal chest pain intermittent in nature.  But constant for the last few hours today.  Patient also thinks he may be going through alcohol withdrawal.  He is a heavy drinker and is got the tremors and he usually gets chest pain with it.  Patient sent in from urgent care.  Patient given 324 mg of aspirin by EMS.  Temp was 98.2 blood pressure was 149/108 oxygen is 98% pulse is 85 respirations 16 patient seen at [facility], ED on April 28 for dental pain.  Patient states that he recently had his molar on the right lower side pulled by a dentist.  He has pain in that area.  Patient also seen by [place] April 1 for alcohol abuse.  And recently seen by [facility] regional ED for GI bleed.  But was deemed stable in the ED.       Home Medications Prior to Admission medications   Medication Sig Start Date End Date Taking? Authorizing Provider  amoxicillin-clavulanate (AUGMENTIN) 875-125 MG tablet Take 1 tablet by mouth 2 (two) times daily. Patient not taking: Reported on 11/11/2022 09/03/22   [provider]  aspirin 500 MG EC tablet Take 500 mg by mouth every 6 (six) hours as needed for pain.    [provider]  chlorhexidine (PERIDEX) 0.12 % solution Use as directed 15 mLs in the mouth or throat 2 (two) times daily. Patient not taking: Reported on 11/11/2022 09/03/22   [provider]  dicyclomine (BENTYL) 20 MG tablet Take 1 tablet (20 mg total) by mouth 4 (four) times daily -  before meals and at bedtime for 10 days. Patient not taking: Reported on 11/11/2022 07/02/17 07/12/17  Cristina Gong, PA-C  hydrOXYzine (ATARAX) 25 MG tablet Take 1 tablet (25 mg total) by mouth every 8 (eight) hours as  needed. Patient not taking: Reported on 11/11/2022 09/09/22   Tilden Fossa, MD  ibuprofen (ADVIL) 600 MG tablet Take 1 tablet (600 mg total) by mouth every 6 (six) hours as needed. 11/11/22   Lonell Grandchild, MD  predniSONE (STERAPRED UNI-PAK 21 TAB) 10 MG (21) TBPK tablet Take by mouth daily. Take 6 tabs by mouth daily  for 2 days, then 5 tabs for 2 days, then 4 tabs for 2 days, then 3 tabs for 2 days, 2 tabs for 2 days, then 1 tab by mouth daily for 2 days 11/11/22   Lonell Grandchild, MD  triamcinolone cream (KENALOG) 0.1 % Apply 1 Application topically 2 (two) times daily. 11/11/22   Lonell Grandchild, MD      Allergies    Penicillins    Review of Systems   Review of Systems  Constitutional:  Negative for chills and fever.  HENT:  Negative for ear pain and sore throat.   Eyes:  Negative for pain and visual disturbance.  Respiratory:  Negative for cough and shortness of breath.   Cardiovascular:  Positive for chest pain. Negative for palpitations.  Gastrointestinal:  Negative for abdominal pain and vomiting.  Genitourinary:  Negative for dysuria and hematuria.  Musculoskeletal:  Negative for arthralgias and back pain.  Skin:  Negative for color change and rash.  Neurological:  Positive for tremors. Negative for seizures and syncope.  Psychiatric/Behavioral:  The patient is nervous/anxious.   All other systems reviewed and are negative.   Physical Exam Updated Vital Signs BP (!) 149/108   Pulse 85   Temp 98.2 F (36.8 C) (Oral)   Resp 16   Ht 1.702 m (5\' 7" )   Wt 70.3 kg   SpO2 98%   BMI 24.28 kg/m  Physical Exam Vitals and nursing note reviewed.  Constitutional:      General: He is not in acute distress.    Appearance: Normal appearance. He is well-developed.  HENT:     Head: Normocephalic and atraumatic.     Mouth/Throat:     Comments: Possible dry socket right lower molar socket. Eyes:     Extraocular Movements: Extraocular movements intact.      Conjunctiva/sclera: Conjunctivae normal.     Pupils: Pupils are equal, round, and reactive to light.  Cardiovascular:     Rate and Rhythm: Normal rate and regular rhythm.     Heart sounds: No murmur heard. Pulmonary:     Effort: Pulmonary effort is normal. No respiratory distress.     Breath sounds: Normal breath sounds.  Abdominal:     Palpations: Abdomen is soft.     Tenderness: There is no abdominal tenderness.  Musculoskeletal:        General: No swelling.     Cervical back: Neck supple.  Skin:    General: Skin is warm and dry.     Capillary Refill: Capillary refill takes less than 2 seconds.  Neurological:     General: No focal deficit present.     Mental Status: He is alert and oriented to person, place, and time.     Cranial Nerves: No cranial nerve deficit.     Sensory: No sensory deficit.     Motor: No weakness.     Comments: Patient is showing signs of some tremors.   Psychiatric:        Mood and Affect: Mood normal.     ED Results / Procedures / Treatments   Labs (all labs ordered are listed, but only abnormal results are displayed) Labs Reviewed  CBC WITH DIFFERENTIAL/PLATELET  COMPREHENSIVE METABOLIC PANEL  LIPASE, BLOOD  ETHANOL  SALICYLATE LEVEL  ACETAMINOPHEN LEVEL  RAPID URINE DRUG SCREEN, HOSP PERFORMED  TROPONIN I (HIGH SENSITIVITY)    EKG EKG Interpretation  Date/Time:  Thursday Dec 13 2022 18:29:24 EDT Ventricular Rate:  74 PR Interval:  122 QRS Duration: 92 QT Interval:  395 QTC Calculation: 439 R Axis:   69 Text Interpretation: Sinus rhythm Confirmed by Vanetta Mulders [id number]) on 12/13/2022 6:55:45 PM  Radiology No results found.  Procedures Procedures    Medications Ordered in ED Medications  0.9 %  sodium chloride infusion (has no administration in time range)  sodium chloride 0.9 % bolus 1,000 mL (has no administration in time range)  LORazepam (ATIVAN) injection 0-4 mg (has no administration in time range)    Or  LORazepam  (ATIVAN) tablet 0-4 mg (has no administration in time range)  LORazepam (ATIVAN) injection 0-4 mg (has no administration in time range)    Or  LORazepam (ATIVAN) tablet 0-4 mg (has no administration in time range)  thiamine (VITAMIN B1) tablet 100 mg (has no administration in time range)    Or  thiamine (VITAMIN B1) injection 100 mg (has no administration in time range)    ED Course/ Medical Decision Making/ A&P  Medical Decision Making Amount and/or Complexity of Data Reviewed Labs: ordered. Radiology: ordered.  Risk OTC drugs. Prescription drug management.   Patient started on CIWA protocol.  With the Ativan improved significantly.  Gave him some IV fluids.  Workup for the chest pain troponins x 2 without significant change.  CBC white count was 3.6 hemoglobin 12.3 platelets 106.  Complete metabolic panel potassium was 3.2 sodium 134 blood sugar 76 total bili was 1.7 alk phos normal AST ALT was 121 and 104 respectively renal function was normal.  Lipase was elevated little bit at 75 patient has no chest pain.  Alcohol level was less than 10 Tylenol and salicylate levels were not elevated.  Urine drug screen was negative.  Chest x-ray had no active disease.  EKG had no acute changes and consistent with sinus rhythm.  Patient wants to be discharged home with some Ativan and says he will follow-up with behavioral health urgent care.  Also patient does have a dentist to follow-up with regarding his possible dry socket.   Final Clinical Impression(s) / ED Diagnoses Final diagnoses:  Precordial pain  Alcohol withdrawal syndrome without complication [facility])    Rx / DC Orders ED Discharge Orders     None         Vanetta Mulders, MD 12/13/22 2319

## 2022-12-13 NOTE — Discharge Instructions (Addendum)
Take the Ativan as directed.  Recommend that she follow-up with behavioral health urgent care information provided above.  Also you can follow-up with the wellness clinic if you need a regular doctor.  Would also follow back up with your dentist for the tooth pain.  Because of your history of the upper GI bleed I would not take Motrin I would take extra strength Tylenol for the tooth pain.  Return for any new or worse symptoms.  Workup for your heart was fine.

## 2023-01-19 ENCOUNTER — Emergency Department (HOSPITAL_COMMUNITY)
Admission: EM | Admit: 2023-01-19 | Discharge: 2023-01-19 | Disposition: A | Discharge status: Home / Self Care | Payer: MEDICAID | Attending: Emergency Medicine | Admitting: Emergency Medicine

## 2023-01-19 ENCOUNTER — Emergency Department (HOSPITAL_COMMUNITY): Payer: MEDICAID

## 2023-01-19 DIAGNOSIS — R0789 Other chest pain: Secondary | ICD-10-CM | POA: Insufficient documentation

## 2023-01-19 DIAGNOSIS — E871 Hypo-osmolality and hyponatremia: Secondary | ICD-10-CM | POA: Diagnosis not present

## 2023-01-19 DIAGNOSIS — F1010 Alcohol abuse, uncomplicated: Secondary | ICD-10-CM | POA: Diagnosis not present

## 2023-01-19 DIAGNOSIS — Z7982 Long term (current) use of aspirin: Secondary | ICD-10-CM | POA: Insufficient documentation

## 2023-01-19 DIAGNOSIS — R252 Cramp and spasm: Secondary | ICD-10-CM | POA: Diagnosis not present

## 2023-01-19 LAB — ETHANOL: Alcohol, Ethyl (B): <10 mg/dL (ref <10)

## 2023-01-19 LAB — RAPID URINE DRUG SCREEN, HOSP PERFORMED
Amphetamines: NOT DETECTED
Barbiturates: NOT DETECTED
Benzodiazepines: POSITIVE — AB
Cocaine: NOT DETECTED
Opiates: NOT DETECTED
Tetrahydrocannabinol: POSITIVE — AB

## 2023-01-19 LAB — URINALYSIS, ROUTINE W REFLEX MICROSCOPIC
Bacteria, UA: NONE SEEN
Bilirubin Urine: NEGATIVE
Glucose, UA: NEGATIVE mg/dL
Ketones, ur: 20 mg/dL — AB
Leukocytes,Ua: NEGATIVE
Nitrite: NEGATIVE
Protein, ur: 100 mg/dL — AB
Specific Gravity, Urine: 1.027 (ref 1.005–1.030)
pH: 6 (ref 5.0–8.0)

## 2023-01-19 LAB — COMPREHENSIVE METABOLIC PANEL
ALT: 32 U/L (ref 0–44)
AST: 36 U/L (ref 15–41)
Albumin: 4.9 g/dL (ref 3.5–5.0)
Alkaline Phosphatase: 66 U/L (ref 38–126)
Anion gap: 15 (ref 5–15)
BUN: 17 mg/dL (ref 6–20)
CO2: 27 mmol/L (ref 22–32)
Calcium: 9.8 mg/dL (ref 8.9–10.3)
Chloride: 92 mmol/L — ABNORMAL LOW (ref 98–111)
Creatinine, Ser: 0.75 mg/dL (ref 0.61–1.24)
GFR, Estimated: >60 mL/min (ref >60)
Glucose, Bld: 104 mg/dL — ABNORMAL HIGH (ref 70–99)
Potassium: 3.5 mmol/L (ref 3.5–5.1)
Sodium: 134 mmol/L — ABNORMAL LOW (ref 135–145)
Total Bilirubin: 1.6 mg/dL — ABNORMAL HIGH (ref 0.3–1.2)
Total Protein: 7.9 g/dL (ref 6.5–8.1)

## 2023-01-19 LAB — CBC WITH DIFFERENTIAL/PLATELET
Abs Immature Granulocytes: 0.02 10*3/uL (ref 0.00–0.07)
Basophils Absolute: 0 10*3/uL (ref 0.0–0.1)
Basophils Relative: 0 %
Eosinophils Absolute: 0 10*3/uL (ref 0.0–0.5)
Eosinophils Relative: 0 %
HCT: 42.6 % (ref 39.0–52.0)
Hemoglobin: 14.7 g/dL (ref 13.0–17.0)
Immature Granulocytes: 0 %
Lymphocytes Relative: 11 %
Lymphs Abs: 1.1 10*3/uL (ref 0.7–4.0)
MCH: 34.5 pg — ABNORMAL HIGH (ref 26.0–34.0)
MCHC: 34.5 g/dL (ref 30.0–36.0)
MCV: 100 fL (ref 80.0–100.0)
Monocytes Absolute: 0.6 10*3/uL (ref 0.1–1.0)
Monocytes Relative: 6 %
Neutro Abs: 7.7 10*3/uL (ref 1.7–7.7)
Neutrophils Relative %: 83 %
Platelets: 355 10*3/uL (ref 150–400)
RBC: 4.26 MIL/uL (ref 4.22–5.81)
RDW: 13.2 % (ref 11.5–15.5)
WBC: 9.4 10*3/uL (ref 4.0–10.5)
nRBC: 0 % (ref 0.0–0.2)

## 2023-01-19 LAB — TROPONIN I (HIGH SENSITIVITY)
Troponin I (High Sensitivity): 3 ng/L (ref <18)
Troponin I (High Sensitivity): 4 ng/L (ref <18)

## 2023-01-19 MED ORDER — LORAZEPAM 1 MG PO TABS
1.0000 mg | ORAL_TABLET | Freq: Once | ORAL | Status: AC
  Administered 2023-01-19: 1 mg via ORAL
  Filled 2023-01-19: qty 1

## 2023-01-19 NOTE — Discharge Instructions (Addendum)
Thank you for allowing [place] to be a part of your care today.  You were evaluated in the ED for chest pain and muscle cramps.   Your workup is overall reassuring.  You did not have evidence of heart attack or damage to your heart.  Your electrolytes (including potassium) were also normal today.  I recommend increasing your overall food and water intake as muscle cramps may be related to dehydration.   I have provided you the information for [facility].  You may walk-in and request evaluation/detox.  I have also provided you with resources for substance abuse help in the area.    Return to the ED if you develop sudden worsening of your symptoms or if you have any new concerns.

## 2023-01-19 NOTE — ED Provider Notes (Signed)
[place] EMERGENCY DEPARTMENT AT [facility] Provider Note   CSN: [id number] Arrival date & time: 01/19/23  1149     History {Add pertinent medical, surgical, social history, OB history to HPI:1} Chief Complaint  Patient presents with   Chest Pain    Joseph Walters is a 52 y.o. male        Home Medications Prior to Admission medications   Medication Sig Start Date End Date Taking? Authorizing Provider  aspirin 500 MG EC tablet Take 500 mg by mouth every 6 (six) hours as needed for pain.    [provider]  atenolol (TENORMIN) 50 MG tablet Take 50 mg by mouth daily.    [provider]  DESVENLAFAXINE ER PO Take 1 tablet by mouth daily.    [provider]  dicyclomine (BENTYL) 20 MG tablet Take 1 tablet (20 mg total) by mouth 4 (four) times daily -  before meals and at bedtime for 10 days. Patient not taking: Reported on 11/11/2022 07/02/17 07/12/17  Cristina Gong, PA-C  escitalopram (LEXAPRO) 10 MG tablet Take 10 mg by mouth daily.    [provider]  hydrOXYzine (ATARAX) 25 MG tablet Take 1 tablet (25 mg total) by mouth every 8 (eight) hours as needed. Patient not taking: Reported on 11/11/2022 09/09/22   Tilden Fossa, MD  ibuprofen (ADVIL) 600 MG tablet Take 1 tablet (600 mg total) by mouth every 6 (six) hours as needed. Patient not taking: Reported on 12/13/2022 11/11/22   Lonell Grandchild, MD  LORazepam (ATIVAN) 1 MG tablet Take 1 tablet (1 mg total) by mouth 3 (three) times daily as needed for anxiety. 12/13/22   Vanetta Mulders, MD  naltrexone (DEPADE) 50 MG tablet Take 50 mg by mouth daily.    [provider]  predniSONE (STERAPRED UNI-PAK 21 TAB) 10 MG (21) TBPK tablet Take by mouth daily. Take 6 tabs by mouth daily  for 2 days, then 5 tabs for 2 days, then 4 tabs for 2 days, then 3 tabs for 2 days, 2 tabs for 2 days, then 1 tab by mouth daily for 2 days Patient not taking: Reported on 12/13/2022 11/11/22    Lonell Grandchild, MD  traZODone (DESYREL) 100 MG tablet Take 100 mg by mouth at bedtime.    [provider]  triamcinolone cream (KENALOG) 0.1 % Apply 1 Application topically 2 (two) times daily. 11/11/22   Lonell Grandchild, MD      Allergies    Penicillins    Review of Systems   Review of Systems  Constitutional:  Negative for fever.  Respiratory:  Negative for chest tightness and shortness of breath.   Cardiovascular:  Positive for chest pain.  Gastrointestinal:  Negative for diarrhea, nausea and vomiting.  Musculoskeletal:  Positive for myalgias (muscle cramps and pain).  Psychiatric/Behavioral:  Negative for suicidal ideas. The patient is nervous/anxious.     Physical Exam Updated Vital Signs BP (!) 141/96   Pulse 87   Temp 98.3 F (36.8 C) (Oral)   Resp 11   Ht 5\' 7"  (1.702 m)   Wt 68 kg   SpO2 99%   BMI 23.49 kg/m  Physical Exam  ED Results / Procedures / Treatments   Labs (all labs ordered are listed, but only abnormal results are displayed) Labs Reviewed  CBC WITH DIFFERENTIAL/PLATELET - Abnormal; Notable for the following components:      Result Value   MCH 34.5 (*)    All other  components within normal limits  COMPREHENSIVE METABOLIC PANEL - Abnormal; Notable for the following components:   Sodium 134 (*)    Chloride 92 (*)    Glucose, Bld 104 (*)    Total Bilirubin 1.6 (*)    All other components within normal limits  ETHANOL  URINALYSIS, ROUTINE W REFLEX MICROSCOPIC  RAPID URINE DRUG SCREEN, HOSP PERFORMED  TROPONIN I (HIGH SENSITIVITY)  TROPONIN I (HIGH SENSITIVITY)    EKG None  Radiology DG Chest 2 View  Result Date: 01/19/2023 CLINICAL DATA:  Chest pain EXAM: CHEST - 2 VIEW COMPARISON:  12/13/2022 FINDINGS: The heart size and mediastinal contours are within normal limits. Both lungs are clear. The visualized skeletal structures are unremarkable. IMPRESSION: No active cardiopulmonary disease. Electronically Signed   By: Duanne Guess D.O.   On: 01/19/2023 13:27    Procedures Procedures  {Document cardiac monitor, telemetry assessment procedure when appropriate:1}  Medications Ordered in ED Medications  LORazepam (ATIVAN) tablet 1 mg (1 mg Oral Given 01/19/23 1345)    ED Course/ Medical Decision Making/ A&P   {   Click here for ABCD2, HEART and other calculatorsREFRESH Note before signing :1}                          Medical Decision Making Amount and/or Complexity of Data Reviewed Labs: ordered. Radiology: ordered.  Risk Prescription drug management.   This patient presents to the ED with chief complaint(s) of anxiety, cramping in his hands and feet with pertinent past medical history of alcohol abuse.  The complaint involves an extensive differential diagnosis and also carries with it a high risk of complications and morbidity.    The differential diagnosis includes ACS, musculoskeletal strain, dehydration, toxic ingestion, alcohol withdrawal   The initial plan is to obtain chest pain workup, UDS, UA  Additional history obtained: Records reviewed previous admission documents - patient was previously admitted for alcohol abuse and suicidal ideation at the end of June with [facility].  Patient has chronic complaint of tremor in his hand when he reaches out.  He was placed under CIWA protocol at that time.   Initial Assessment:   Exam significant for anxious appearing patient who is not in acute distress.  Patient has tremors of hands when he extends them, but no tremor at rest.  Patient's skin is erythematous, especially in the face and neck, this is likely related to him spending a lot of time outdoors.  Patient has tenderness to palpation of left anterior chest wall.  Lungs clear to auscultation bilaterally with adequate tidal volume.  Patient complaining of cramping in his hands, no visible muscle spasms.    Independent ECG/labs interpretation:  The following labs were independently interpreted:   CBC without leukocytosis or anemia.  Metabolic panel with mild hyponatremia, no hypokalemia or other major electrolyte disturbance.  Independent visualization and interpretation of imaging: I independently visualized the following imaging with scope of interpretation limited to determining acute life threatening conditions related to emergency care: chest x-ray, which revealed no evidence of acute cardiopulmonary disease.    Treatment and Reassessment: ***  Disposition:   ***  Social Determinants of Health:   Patient's {[id number]}  increases the complexity of managing their presentation   {Document critical care time when appropriate:1} {Document review of labs and clinical decision tools ie heart score, Chads2Vasc2 etc:1}  {Document your independent review of radiology images, and any outside records:1} {Document your discussion with family members, caretakers,  and with consultants:1} {Document social determinants of health affecting pt's care:1} {Document your decision making why or why not admission, treatments were needed:1} Final Clinical Impression(s) / ED Diagnoses Final diagnoses:  None    Rx / DC Orders ED Discharge Orders     None

## 2023-01-19 NOTE — ED Triage Notes (Addendum)
Patient reports shaking and chest pains for a few days Says he thinks his potassium is dangerously low Supposed to be on potassium pills but doesn't have any Cannot get any until he gets paid Pain rated 8/10  Patient says he struggles with alcohol but cannot stop or he will die  Says he is supposed to take atenolol but hasn't taken in 3 weeks

## 2023-02-27 ENCOUNTER — Other Ambulatory Visit (HOSPITAL_COMMUNITY)
Admission: EM | Admit: 2023-02-27 | Discharge: 2023-03-01 | Disposition: A | Discharge status: Home / Self Care | Payer: MEDICAID | Source: Home / Self Care

## 2023-02-27 ENCOUNTER — Other Ambulatory Visit (HOSPITAL_COMMUNITY)
Admission: EM | Admit: 2023-02-27 | Discharge: 2023-02-27 | Disposition: A | Discharge status: Other Acute Inpatient Hospital | Payer: MEDICAID | Attending: Psychiatry | Admitting: Psychiatry

## 2023-02-27 DIAGNOSIS — F10230 Alcohol dependence with withdrawal, uncomplicated: Secondary | ICD-10-CM | POA: Insufficient documentation

## 2023-02-27 DIAGNOSIS — Z79899 Other long term (current) drug therapy: Secondary | ICD-10-CM | POA: Insufficient documentation

## 2023-02-27 DIAGNOSIS — F1020 Alcohol dependence, uncomplicated: Secondary | ICD-10-CM | POA: Insufficient documentation

## 2023-02-27 DIAGNOSIS — F32A Depression, unspecified: Secondary | ICD-10-CM | POA: Diagnosis not present

## 2023-02-27 DIAGNOSIS — F10930 Alcohol use, unspecified with withdrawal, uncomplicated: Secondary | ICD-10-CM

## 2023-02-27 DIAGNOSIS — F411 Generalized anxiety disorder: Secondary | ICD-10-CM | POA: Diagnosis not present

## 2023-02-27 DIAGNOSIS — F1090 Alcohol use, unspecified, uncomplicated: Secondary | ICD-10-CM | POA: Diagnosis present

## 2023-02-27 LAB — POCT URINE DRUG SCREEN - MANUAL ENTRY (I-SCREEN)
POC Amphetamine UR: NOT DETECTED
POC Buprenorphine (BUP): NOT DETECTED
POC Cocaine UR: NOT DETECTED
POC Marijuana UR: NOT DETECTED
POC Methadone UR: NOT DETECTED
POC Methamphetamine UR: NOT DETECTED
POC Morphine: NOT DETECTED
POC Oxazepam (BZO): POSITIVE — AB
POC Oxycodone UR: NOT DETECTED
POC Secobarbital (BAR): NOT DETECTED

## 2023-02-27 LAB — CBC WITH DIFFERENTIAL/PLATELET
Abs Immature Granulocytes: 0.01 10*3/uL (ref 0.00–0.07)
Basophils Absolute: 0 10*3/uL (ref 0.0–0.1)
Basophils Relative: 1 %
Eosinophils Absolute: 0.1 10*3/uL (ref 0.0–0.5)
Eosinophils Relative: 1 %
HCT: 37.6 % — ABNORMAL LOW (ref 39.0–52.0)
Hemoglobin: 13.1 g/dL (ref 13.0–17.0)
Immature Granulocytes: 0 %
Lymphocytes Relative: 20 %
Lymphs Abs: 1.1 10*3/uL (ref 0.7–4.0)
MCH: 34.1 pg — ABNORMAL HIGH (ref 26.0–34.0)
MCHC: 34.8 g/dL (ref 30.0–36.0)
MCV: 97.9 fL (ref 80.0–100.0)
Monocytes Absolute: 0.4 10*3/uL (ref 0.1–1.0)
Monocytes Relative: 7 %
Neutro Abs: 3.8 10*3/uL (ref 1.7–7.7)
Neutrophils Relative %: 71 %
Platelets: 231 10*3/uL (ref 150–400)
RBC: 3.84 MIL/uL — ABNORMAL LOW (ref 4.22–5.81)
RDW: 12.9 % (ref 11.5–15.5)
WBC: 5.3 10*3/uL (ref 4.0–10.5)
nRBC: 0 % (ref 0.0–0.2)

## 2023-02-27 LAB — COMPREHENSIVE METABOLIC PANEL
ALT: 39 U/L (ref 0–44)
AST: 51 U/L — ABNORMAL HIGH (ref 15–41)
Albumin: 3.4 g/dL — ABNORMAL LOW (ref 3.5–5.0)
Alkaline Phosphatase: 51 U/L (ref 38–126)
Anion gap: 11 (ref 5–15)
BUN: 8 mg/dL (ref 6–20)
CO2: 24 mmol/L (ref 22–32)
Calcium: 8.4 mg/dL — ABNORMAL LOW (ref 8.9–10.3)
Chloride: 100 mmol/L (ref 98–111)
Creatinine, Ser: 0.93 mg/dL (ref 0.61–1.24)
GFR, Estimated: >60 mL/min (ref >60)
Glucose, Bld: 98 mg/dL (ref 70–99)
Potassium: 3.5 mmol/L (ref 3.5–5.1)
Sodium: 135 mmol/L (ref 135–145)
Total Bilirubin: 0.8 mg/dL (ref 0.3–1.2)
Total Protein: 5.5 g/dL — ABNORMAL LOW (ref 6.5–8.1)

## 2023-02-27 LAB — ETHANOL: Alcohol, Ethyl (B): 35 mg/dL — ABNORMAL HIGH (ref <10)

## 2023-02-27 LAB — MAGNESIUM: Magnesium: 1.4 mg/dL — ABNORMAL LOW (ref 1.7–2.4)

## 2023-02-27 LAB — TSH: TSH: 1.267 u[IU]/mL (ref 0.350–4.500)

## 2023-02-27 MED ORDER — ATENOLOL 25 MG PO TABS: 50.0000 mg | ORAL_TABLET | Freq: Every day | ORAL | Status: DC

## 2023-02-27 MED ORDER — LORAZEPAM 1 MG PO TABS: 1.0000 mg | ORAL_TABLET | Freq: Two times a day (BID) | ORAL | Status: DC

## 2023-02-27 MED ORDER — NICOTINE 14 MG/24HR TD PT24: 14.0000 mg | MEDICATED_PATCH | Freq: Every day | TRANSDERMAL | Status: DC

## 2023-02-27 MED ORDER — HYDROXYZINE HCL 25 MG PO TABS: 25.0000 mg | ORAL_TABLET | Freq: Three times a day (TID) | ORAL | Status: DC | PRN

## 2023-02-27 MED ORDER — LORAZEPAM 1 MG PO TABS: 1.0000 mg | ORAL_TABLET | Freq: Every day | ORAL | Status: DC

## 2023-02-27 MED ORDER — NICOTINE 14 MG/24HR TD PT24
14.0000 mg | MEDICATED_PATCH | Freq: Every day | TRANSDERMAL | Status: DC
  Administered 2023-02-27 – 2023-02-28 (×2): 14 mg via TRANSDERMAL
  Filled 2023-02-27 (×2): qty 1

## 2023-02-27 MED ORDER — ALUM & MAG HYDROXIDE-SIMETH 200-200-20 MG/5ML PO SUSP: 30.0000 mL | ORAL | Status: DC | PRN

## 2023-02-27 MED ORDER — ACETAMINOPHEN 325 MG PO TABS: 650.0000 mg | ORAL_TABLET | Freq: Four times a day (QID) | ORAL | Status: DC | PRN

## 2023-02-27 MED ORDER — ONDANSETRON 4 MG PO TBDP: 4.0000 mg | ORAL_TABLET | Freq: Four times a day (QID) | ORAL | Status: DC | PRN

## 2023-02-27 MED ORDER — TRAZODONE HCL 50 MG PO TABS: 50.0000 mg | ORAL_TABLET | Freq: Every evening | ORAL | Status: DC | PRN

## 2023-02-27 MED ORDER — MAGNESIUM HYDROXIDE 400 MG/5ML PO SUSP: 30.0000 mL | Freq: Every day | ORAL | Status: DC | PRN

## 2023-02-27 MED ORDER — LORAZEPAM 1 MG PO TABS: 1.0000 mg | ORAL_TABLET | Freq: Three times a day (TID) | ORAL | Status: DC

## 2023-02-27 MED ORDER — LORAZEPAM 1 MG PO TABS
1.0000 mg | ORAL_TABLET | Freq: Four times a day (QID) | ORAL | Status: AC
  Administered 2023-02-27 – 2023-03-01 (×6): 1 mg via ORAL
  Filled 2023-02-27 (×6): qty 1

## 2023-02-27 MED ORDER — LORAZEPAM 1 MG PO TABS: 1.0000 mg | ORAL_TABLET | Freq: Four times a day (QID) | ORAL | Status: DC | PRN

## 2023-02-27 MED ORDER — THIAMINE HCL 100 MG/ML IJ SOLN: 100.0000 mg | Freq: Once | INTRAMUSCULAR | Status: DC

## 2023-02-27 MED ORDER — THIAMINE MONONITRATE 100 MG PO TABS: 100.0000 mg | ORAL_TABLET | Freq: Every day | ORAL | Status: DC

## 2023-02-27 MED ORDER — LOPERAMIDE HCL 2 MG PO CAPS: 2.0000 mg | ORAL_CAPSULE | ORAL | Status: DC | PRN

## 2023-02-27 MED ORDER — LORAZEPAM 1 MG PO TABS: 1.0000 mg | ORAL_TABLET | Freq: Four times a day (QID) | ORAL | Status: DC

## 2023-02-27 MED ORDER — THIAMINE HCL 100 MG/ML IJ SOLN
100.0000 mg | Freq: Once | INTRAMUSCULAR | Status: AC
  Administered 2023-02-27: 100 mg via INTRAMUSCULAR
  Filled 2023-02-27: qty 2

## 2023-02-27 MED ORDER — THIAMINE MONONITRATE 100 MG PO TABS
100.0000 mg | ORAL_TABLET | Freq: Every day | ORAL | Status: DC
  Administered 2023-02-28 – 2023-03-01 (×2): 100 mg via ORAL
  Filled 2023-02-27 (×2): qty 1

## 2023-02-27 MED ORDER — ADULT MULTIVITAMIN W/MINERALS CH: 1.0000 | ORAL_TABLET | Freq: Every day | ORAL | Status: DC

## 2023-02-27 MED ORDER — LORAZEPAM 1 MG PO TABS
1.0000 mg | ORAL_TABLET | Freq: Four times a day (QID) | ORAL | Status: DC | PRN
  Administered 2023-02-27: 1 mg via ORAL
  Filled 2023-02-27: qty 1

## 2023-02-27 MED ORDER — ADULT MULTIVITAMIN W/MINERALS CH
1.0000 | ORAL_TABLET | Freq: Every day | ORAL | Status: DC
  Administered 2023-02-27 – 2023-03-01 (×3): 1 via ORAL
  Filled 2023-02-27 (×3): qty 1

## 2023-02-27 MED ORDER — ACETAMINOPHEN 325 MG PO TABS
650.0000 mg | ORAL_TABLET | Freq: Four times a day (QID) | ORAL | Status: DC | PRN
  Administered 2023-02-27: 650 mg via ORAL
  Filled 2023-02-27: qty 2

## 2023-02-27 NOTE — ED Notes (Signed)
Washing pt clothes will put in his locker because they have strings on the shorts and he doesn't want them cut.

## 2023-02-27 NOTE — ED Notes (Signed)
Report was given to [facility] on the [facility] unit

## 2023-02-27 NOTE — ED Provider Notes (Signed)
Facility Based Crisis Admission H&P  Date: 02/27/23 Patient Name: Joseph Walters MRN: [id number] Chief Complaint: Alcohol Use Disorder  Diagnoses:  Final diagnoses:  Alcohol use disorder    HPI: Patient presents voluntarily to [facility] behavioral health for walk-in assessment.  Patient reports he was evaluated at [facility], arrived to [facility] last night. Traveled to [facility] behavioral health today via [facility], facilitated by [facility].    Patient is assessed, face-to-face, by nurse practitioner.  He is seated in assessment area, no acute distress.  He is alert and oriented, pleasant and cooperative during assessment. Patient  presents with depressed mood, congruent affect.   Joseph Walters presents today seeking treatment for alcohol use disorder.  He states "I want to quit for myself and for my mom and try to live right.  It is sad, I have tried to quit so many times.  I was fired from a job because they smelled alcohol on me, I realized I have a problem, alcohol is becoming toxic to me."  Patient endorses using alcohol up to one fifth of wine  that is "20% alcohol" and average of 6 days/week.  Most recent alcohol use last night, approximately 24 hours ago.  He endorses history of alcohol-related seizure, one episode, that occurred 3 years ago.  He has sought substance use treatment several times before.  Most recently at [facility] for a 7-day alcohol detox in February 2024.  Upon discharge from [facility] patient remained sober for 10 days prior to relapse on alcohol.  He denies substance use aside from alcohol.  Patient endorses daily use of nicotine/tobacco. Patient would like to be admitted to [facility] program in [place].  He has been in contact with [facility] program and they may have availability in early September 2024.  Joseph Walters is not linked with outpatient psychiatry currently.  Most recently linked with outpatient  psychiatry in December 2023.  He reports diagnosis of generalized anxiety disorder.  He has been prescribed clonazepam, most recently compliant with clonazepam 3 months ago.  He has been prescribed Pristiq however he lost this medication, related to his alcohol use, approximately 3 weeks ago.  He is typically compliant with atenolol, last took his atenolol 2 days ago. Patient endorses 2 previous inpatient psychiatric hospitalizations.  Most recent hospitalization at [facility] several months ago after an attempted suicide attempt.  He ingested an intentional overdose of his atenolol medication.  Additionally, in 1990, patient briefly admitted for inpatient psychiatric hospitalizations after a separation from his wife.  He  denies suicidal and homicidal ideations. One previous suicide attempt. He denies  history of nonsuicidal self-harm.  Patient easily  contracts verbally for safety with this Clinical research associate.    Patient has normal speech and behavior.  He  denies auditory and visual hallucinations currently.  Patient reports he has experienced visual hallucinations with alcohol use in the past.  Patient is able to converse coherently with goal-directed thoughts and no distractibility or preoccupation.  Denies symptoms of paranoia.  Objectively there is no evidence of psychosis/mania or delusional thinking.  [name] resides in [place] with his parents. He denies access to weapons. He is not formally employed, earning income by donating plasma. Patient endorses average sleep and appetite.  Patient offered support and encouragement.  Reviewed medications including trazodone, hydroxyzin and lorazepam.  Discussed potential side effects and offered patient opportunity to ask questions.  Patient verbalizes desire to remain full CODE STATUS. Patient verbalizes understanding and  agreement with treatment plan to include admission to facility based crisis unit and potential admission to residential substance use  treatment moving forward.   PHQ 2-9:  Flowsheet Row ED from 02/27/2023 in [facility]  Thoughts that you would be better off dead, or of hurting yourself in some way Not at all  PHQ-9 Total Score 17       Flowsheet Row ED from 02/27/2023 in [facility] ED from 01/19/2023 in [facility] Emergency Department at [facility] ED from 12/13/2022 in [facility] Emergency Department at [facility]  C-SSRS RISK CATEGORY No Risk No Risk No Risk         Total Time spent with patient: 45 minutes  Musculoskeletal  Strength & Muscle Tone: within normal limits Gait & Station: normal Patient leans: N/A  Psychiatric Specialty Exam  Presentation General Appearance:  Appropriate for Environment; Casual  Eye Contact: Good  Speech: Clear and Coherent; Normal Rate  Speech Volume: Normal  Handedness: Right   Mood and Affect  Mood: Depressed  Affect: Depressed   Thought Process  Thought Processes: Coherent; Goal Directed; Linear  Descriptions of Associations:Intact  Orientation:Full (Time, Place and Person)  Thought Content:Logical; WDL    Hallucinations:Hallucinations: None  Ideas of Reference:None  Suicidal Thoughts:Suicidal Thoughts: No  Homicidal Thoughts:Homicidal Thoughts: No   Sensorium  Memory: Immediate Good; Recent Fair  Judgment: Fair  Insight: Fair   Art therapist  Concentration: Good  Attention Span: Good  Recall: Good  Fund of Knowledge: Good  Language: Good   Psychomotor Activity  Psychomotor Activity: Psychomotor Activity: Normal   Assets  Assets: Communication Skills; Desire for Improvement; Financial Resources/Insurance; Housing; Physical Health; Resilience; Social Support   Sleep  Sleep: Sleep: Poor   Nutritional Assessment (For OBS and FBC admissions only) Has the patient had a weight loss or gain of 10 pounds or more in the last 3  months?: No Has the patient had a decrease in food intake/or appetite?: No Does the patient have dental problems?: No Does the patient have eating habits or behaviors that may be indicators of an eating disorder including binging or inducing vomiting?: No Has the patient recently lost weight without trying?: 0 Has the patient been eating poorly because of a decreased appetite?: 0 Malnutrition Screening Tool Score: 0    Physical Exam Vitals and nursing note reviewed.  Constitutional:      Appearance: Normal appearance. He is well-developed.  HENT:     Head: Normocephalic and atraumatic.     Nose: Nose normal.  Cardiovascular:     Rate and Rhythm: Normal rate.  Pulmonary:     Effort: Pulmonary effort is normal.  Musculoskeletal:        General: Normal range of motion.     Cervical back: Normal range of motion.  Skin:    General: Skin is warm and dry.  Neurological:     Mental Status: He is alert and oriented to person, place, and time.  Psychiatric:        Attention and Perception: Attention and perception normal.        Mood and Affect: Affect normal. Mood is depressed.        Speech: Speech normal.        Behavior: Behavior normal. Behavior is cooperative.        Thought Content: Thought content normal.        Cognition and Memory: Cognition and memory normal.    Review  of Systems  Constitutional: Negative.   HENT: Negative.    Eyes: Negative.   Respiratory: Negative.    Cardiovascular: Negative.   Gastrointestinal: Negative.   Genitourinary: Negative.   Musculoskeletal: Negative.   Skin: Negative.   Neurological: Negative.   Psychiatric/Behavioral:  Positive for substance abuse.     Blood pressure 121/86, pulse 95, temperature 99.2 F (37.3 C), temperature source Oral, resp. rate 20, SpO2 100%. There is no height or weight on file to calculate BMI.  Past Psychiatric History: alcohol use disorder, per patient report-anxiety  Is the patient at risk to self? No   Has the patient been a risk to self in the past 6 months? No .    Has the patient been a risk to self within the distant past? Yes   Is the patient a risk to others? No   Has the patient been a risk to others in the past 6 months? No   Has the patient been a risk to others within the distant past? No   Past Medical History: none reported Family History: mother- MDD Social History: resides with parents, unemployed  Last Labs:  Admission on 02/27/2023, Discharged on 02/27/2023  Component Date Value Ref Range Status   POC Amphetamine UR 02/27/2023 None Detected  NONE DETECTED (Cut Off Level 1000 ng/mL) Final   POC Secobarbital (BAR) 02/27/2023 None Detected  NONE DETECTED (Cut Off Level 300 ng/mL) Final   POC Buprenorphine (BUP) 02/27/2023 None Detected  NONE DETECTED (Cut Off Level 10 ng/mL) Final   POC Oxazepam (BZO) 02/27/2023 Positive (A)  NONE DETECTED (Cut Off Level 300 ng/mL) Final   POC Cocaine UR 02/27/2023 None Detected  NONE DETECTED (Cut Off Level 300 ng/mL) Final   POC Methamphetamine UR 02/27/2023 None Detected  NONE DETECTED (Cut Off Level 1000 ng/mL) Final   POC Morphine 02/27/2023 None Detected  NONE DETECTED (Cut Off Level 300 ng/mL) Final   POC Methadone UR 02/27/2023 None Detected  NONE DETECTED (Cut Off Level 300 ng/mL) Final   POC Oxycodone UR 02/27/2023 None Detected  NONE DETECTED (Cut Off Level 100 ng/mL) Final   POC Marijuana UR 02/27/2023 None Detected  NONE DETECTED (Cut Off Level 50 ng/mL) Final  Admission on 01/19/2023, Discharged on 01/19/2023  Component Date Value Ref Range Status   Troponin I (High Sensitivity) 01/19/2023 3  <18 ng/L Final   Comment: (NOTE) Elevated high sensitivity troponin I (hsTnI) values and significant  changes across serial measurements may suggest ACS but many other  chronic and acute conditions are known to elevate hsTnI results.  Refer to the "Links" section for chest pain algorithms and additional  guidance. Performed at  [facility], [address] [place], [place] [address]    WBC 01/19/2023 9.4  4.0 - 10.5 K/uL Final   RBC 01/19/2023 4.26  4.22 - 5.81 MIL/uL Final   Hemoglobin 01/19/2023 14.7  13.0 - 17.0 g/dL Final   HCT 13/02/6577 42.6  39.0 - 52.0 % Final   MCV 01/19/2023 100.0  80.0 - 100.0 fL Final   MCH 01/19/2023 34.5 (H)  26.0 - 34.0 pg Final   MCHC 01/19/2023 34.5  30.0 - 36.0 g/dL Final   RDW 46/96/2952 13.2  11.5 - 15.5 % Final   Platelets 01/19/2023 355  150 - 400 K/uL Final   nRBC 01/19/2023 0.0  0.0 - 0.2 % Final   Neutrophils Relative % 01/19/2023 83  % Final   Neutro Abs 01/19/2023 7.7  1.7 -  7.7 K/uL Final   Lymphocytes Relative 01/19/2023 11  % Final   Lymphs Abs 01/19/2023 1.1  0.7 - 4.0 K/uL Final   Monocytes Relative 01/19/2023 6  % Final   Monocytes Absolute 01/19/2023 0.6  0.1 - 1.0 K/uL Final   Eosinophils Relative 01/19/2023 0  % Final   Eosinophils Absolute 01/19/2023 0.0  0.0 - 0.5 K/uL Final   Basophils Relative 01/19/2023 0  % Final   Basophils Absolute 01/19/2023 0.0  0.0 - 0.1 K/uL Final   Immature Granulocytes 01/19/2023 0  % Final   Abs Immature Granulocytes 01/19/2023 0.02  0.00 - 0.07 K/uL Final   Performed at [facility], [address], [place], [place] [address]   Alcohol, Ethyl (B) 01/19/2023 <10  <10 mg/dL Final   Comment: (NOTE) Lowest detectable limit for serum alcohol is 10 mg/dL.  For medical purposes only. Performed at [facility], [address], [place], [place] [address]    Color, Urine 01/19/2023 YELLOW  YELLOW Final   APPearance 01/19/2023 CLEAR  CLEAR Final   Specific Gravity, Urine 01/19/2023 1.027  1.005 - 1.030 Final   pH 01/19/2023 6.0  5.0 - 8.0 Final   Glucose, UA 01/19/2023 NEGATIVE  NEGATIVE mg/dL Final   Hgb urine dipstick 01/19/2023 MODERATE (A)  NEGATIVE Final   Bilirubin Urine 01/19/2023 NEGATIVE  NEGATIVE Final   Ketones, ur 01/19/2023 20 (A)  NEGATIVE mg/dL Final    Protein, ur 30/16/0109 100 (A)  NEGATIVE mg/dL Final   Nitrite 32/35/5732 NEGATIVE  NEGATIVE Final   Leukocytes,Ua 01/19/2023 NEGATIVE  NEGATIVE Final   RBC / HPF 01/19/2023 6-10  0 - 5 RBC/hpf Final   WBC, UA 01/19/2023 0-5  0 - 5 WBC/hpf Final   Bacteria, UA 01/19/2023 NONE SEEN  NONE SEEN Final   Squamous Epithelial / HPF 01/19/2023 0-5  0 - 5 /HPF Final   Mucus 01/19/2023 PRESENT   Final   Performed at [facility], [address] [place], [place] [address]   Opiates 01/19/2023 NONE DETECTED  NONE DETECTED Final   Cocaine 01/19/2023 NONE DETECTED  NONE DETECTED Final   Benzodiazepines 01/19/2023 POSITIVE (A)  NONE DETECTED Final   Amphetamines 01/19/2023 NONE DETECTED  NONE DETECTED Final   Tetrahydrocannabinol 01/19/2023 POSITIVE (A)  NONE DETECTED Final   Barbiturates 01/19/2023 NONE DETECTED  NONE DETECTED Final   Comment: (NOTE) DRUG SCREEN FOR MEDICAL PURPOSES ONLY.  IF CONFIRMATION IS NEEDED FOR ANY PURPOSE, NOTIFY LAB WITHIN 5 DAYS.  LOWEST DETECTABLE LIMITS FOR URINE DRUG SCREEN Drug Class                     Cutoff (ng/mL) Amphetamine and metabolites    1000 Barbiturate and metabolites    200 Benzodiazepine                 200 Opiates and metabolites        300 Cocaine and metabolites        300 THC                            50 Performed at [facility], [address], [place], [place] [address]    Sodium 01/19/2023 134 (L)  135 - 145 mmol/L Final   Potassium 01/19/2023 3.5  3.5 - 5.1 mmol/L Final   Chloride 01/19/2023 92 (L)  98 - 111 mmol/L Final   CO2 01/19/2023 27  22 - 32 mmol/L Final   Glucose, Bld 01/19/2023 104 (H)  70 - 99 mg/dL Final   Glucose reference range applies only to samples taken after fasting for at least 8 hours.   BUN 01/19/2023 17  6 - 20 mg/dL Final   Creatinine, Ser 01/19/2023 0.75  0.61 - 1.24 mg/dL Final   Calcium 16/04/9603 9.8  8.9 - 10.3 mg/dL Final   Total Protein 54/03/8118 7.9  6.5 - 8.1  g/dL Final   Albumin 14/78/2956 4.9  3.5 - 5.0 g/dL Final   AST 21/30/8657 36  15 - 41 U/L Final   ALT 01/19/2023 32  0 - 44 U/L Final   Alkaline Phosphatase 01/19/2023 66  38 - 126 U/L Final   Total Bilirubin 01/19/2023 1.6 (H)  0.3 - 1.2 mg/dL Final   GFR, Estimated 01/19/2023 >60  >60 mL/min Final   Comment: (NOTE) Calculated using the CKD-EPI Creatinine Equation (2021)    Anion gap 01/19/2023 15  5 - 15 Final   Performed at [facility], [address] [place], [place] [address]   Troponin I (High Sensitivity) 01/19/2023 4  <18 ng/L Final   Comment: (NOTE) Elevated high sensitivity troponin I (hsTnI) values and significant  changes across serial measurements may suggest ACS but many other  chronic and acute conditions are known to elevate hsTnI results.  Refer to the "Links" section for chest pain algorithms and additional  guidance. Performed at [facility], [address] [place], [place] [address]   Admission on 12/13/2022, Discharged on 12/13/2022  Component Date Value Ref Range Status   Troponin I (High Sensitivity) 12/13/2022 9  <18 ng/L Final   Comment: (NOTE) Elevated high sensitivity troponin I (hsTnI) values and significant  changes across serial measurements may suggest ACS but many other  chronic and acute conditions are known to elevate hsTnI results.  Refer to the "Links" section for chest pain algorithms and additional  guidance. Performed at [facility], [address] [place], [place] [address]    WBC 12/13/2022 3.6 (L)  4.0 - 10.5 K/uL Final   RBC 12/13/2022 3.63 (L)  4.22 - 5.81 MIL/uL Final   Hemoglobin 12/13/2022 12.3 (L)  13.0 - 17.0 g/dL Final   HCT 40/04/2724 35.7 (L)  39.0 - 52.0 % Final   MCV 12/13/2022 98.3  80.0 - 100.0 fL Final   MCH 12/13/2022 33.9  26.0 - 34.0 pg Final   MCHC 12/13/2022 34.5  30.0 - 36.0 g/dL Final   RDW 36/64/4034 13.6  11.5 - 15.5 % Final   Platelets 12/13/2022  106 (L)  150 - 400 K/uL Final   nRBC 12/13/2022 0.0  0.0 - 0.2 % Final   Neutrophils Relative % 12/13/2022 62  % Final   Neutro Abs 12/13/2022 2.2  1.7 - 7.7 K/uL Final   Lymphocytes Relative 12/13/2022 25  % Final   Lymphs Abs 12/13/2022 0.9  0.7 - 4.0 K/uL Final   Monocytes Relative 12/13/2022 12  % Final   Monocytes Absolute 12/13/2022 0.5  0.1 - 1.0 K/uL Final   Eosinophils Relative 12/13/2022 1  % Final   Eosinophils Absolute 12/13/2022 0.0  0.0 - 0.5 K/uL Final   Basophils Relative 12/13/2022 0  % Final   Basophils Absolute 12/13/2022 0.0  0.0 - 0.1 K/uL Final   Immature Granulocytes 12/13/2022 0  % Final   Abs Immature Granulocytes 12/13/2022 0.01  0.00 - 0.07 K/uL Final   Performed at  [facility], [address], [place], [place] [address]   Sodium 12/13/2022 134 (L)  135 - 145 mmol/L Final   Potassium 12/13/2022 3.2 (L)  3.5 - 5.1 mmol/L Final   Chloride 12/13/2022 96 (L)  98 - 111 mmol/L Final   CO2 12/13/2022 25  22 - 32 mmol/L Final   Glucose, Bld 12/13/2022 76  70 - 99 mg/dL Final   Glucose reference range applies only to samples taken after fasting for at least 8 hours.   BUN 12/13/2022 11  6 - 20 mg/dL Final   Creatinine, Ser 12/13/2022 0.80  0.61 - 1.24 mg/dL Final   Calcium 60/45/4098 9.2  8.9 - 10.3 mg/dL Final   Total Protein 11/91/4782 7.1  6.5 - 8.1 g/dL Final   Albumin 95/62/1308 4.1  3.5 - 5.0 g/dL Final   AST 65/78/4696 121 (H)  15 - 41 U/L Final   ALT 12/13/2022 104 (H)  0 - 44 U/L Final   Alkaline Phosphatase 12/13/2022 60  38 - 126 U/L Final   Total Bilirubin 12/13/2022 1.7 (H)  0.3 - 1.2 mg/dL Final   GFR, Estimated 12/13/2022 >60  >60 mL/min Final   Comment: (NOTE) Calculated using the CKD-EPI Creatinine Equation (2021)    Anion gap 12/13/2022 13  5 - 15 Final   Performed at [facility], [address], [place], [place] [address]   Lipase 12/13/2022 75 (H)  11 - 51 U/L Final   Performed at [facility], [address] [place], [place] [address]   Alcohol, Ethyl (B) 12/13/2022 <10  <10 mg/dL Final   Comment: (NOTE) Lowest detectable limit for serum alcohol is 10 mg/dL.  For medical purposes only. Performed at [facility], [address], [place], [place] [address]    Salicylate Lvl 12/13/2022 <7.0 (L)  7.0 - 30.0 mg/dL Final   Performed at [facility], [address], [place], [place] [address]   Acetaminophen (Tylenol), Serum 12/13/2022 <10 (L)  10 - 30 ug/mL Final   Comment: (NOTE) Therapeutic concentrations vary significantly. A range of 10-30 ug/mL  may be an effective concentration for many patients. However, some  are best treated at concentrations outside of this range. Acetaminophen concentrations >150 ug/mL at 4 hours after ingestion  and >50 ug/mL at 12 hours after ingestion are often associated with  toxic reactions.  Performed at [facility], [address], [place], [place] [address]    Opiates 12/13/2022 NONE DETECTED  NONE DETECTED Final   Cocaine 12/13/2022 NONE DETECTED  NONE DETECTED Final   Benzodiazepines 12/13/2022 NONE DETECTED  NONE DETECTED Final   Amphetamines 12/13/2022 NONE DETECTED  NONE DETECTED Final   Tetrahydrocannabinol 12/13/2022 NONE DETECTED  NONE DETECTED Final   Barbiturates 12/13/2022 NONE DETECTED  NONE DETECTED Final   Comment: (NOTE) DRUG SCREEN FOR MEDICAL PURPOSES ONLY.  IF CONFIRMATION IS NEEDED FOR ANY PURPOSE, NOTIFY LAB WITHIN 5 DAYS.  LOWEST DETECTABLE LIMITS FOR URINE DRUG SCREEN Drug Class                     Cutoff (ng/mL) Amphetamine and metabolites    1000 Barbiturate and metabolites    200 Benzodiazepine                 200 Opiates and metabolites        300 Cocaine and metabolites        300 THC  50 Performed at [facility], [address], [place], [place] [address]    Troponin I (High Sensitivity)  12/13/2022 10  <18 ng/L Final   Comment: (NOTE) Elevated high sensitivity troponin I (hsTnI) values and significant  changes across serial measurements may suggest ACS but many other  chronic and acute conditions are known to elevate hsTnI results.  Refer to the "Links" section for chest pain algorithms and additional  guidance. Performed at [facility], [address] [place], [place] [address]   Admission on 11/11/2022, Discharged on 11/11/2022  Component Date Value Ref Range Status   Lipase 11/11/2022 107 (H)  11 - 51 U/L Final   Performed at [facility], [address], [place], [place] [address]   Sodium 11/11/2022 134 (L)  135 - 145 mmol/L Final   Potassium 11/11/2022 3.4 (L)  3.5 - 5.1 mmol/L Final   Chloride 11/11/2022 98  98 - 111 mmol/L Final   CO2 11/11/2022 26  22 - 32 mmol/L Final   Glucose, Bld 11/11/2022 114 (H)  70 - 99 mg/dL Final   Glucose reference range applies only to samples taken after fasting for at least 8 hours.   BUN 11/11/2022 14  6 - 20 mg/dL Final   Creatinine, Ser 11/11/2022 0.97  0.61 - 1.24 mg/dL Final   Calcium 62/95/2841 9.5  8.9 - 10.3 mg/dL Final   Total Protein 32/44/0102 6.7  6.5 - 8.1 g/dL Final   Albumin 72/53/6644 4.0  3.5 - 5.0 g/dL Final   AST 03/47/4259 26  15 - 41 U/L Final   ALT 11/11/2022 37  0 - 44 U/L Final   Alkaline Phosphatase 11/11/2022 57  38 - 126 U/L Final   Total Bilirubin 11/11/2022 1.1  0.3 - 1.2 mg/dL Final   GFR, Estimated 11/11/2022 >60  >60 mL/min Final   Comment: (NOTE) Calculated using the CKD-EPI Creatinine Equation (2021)    Anion gap 11/11/2022 10  5 - 15 Final   Performed at [facility], [address], [place], [place] [address]   WBC 11/11/2022 6.0  4.0 - 10.5 K/uL Final   RBC 11/11/2022 3.75 (L)  4.22 - 5.81 MIL/uL Final   Hemoglobin 11/11/2022 12.8 (L)  13.0 - 17.0 g/dL Final   HCT 56/43/3295 36.2 (L)  39.0 - 52.0 % Final   MCV 11/11/2022 96.5  80.0 - 100.0 fL Final    MCH 11/11/2022 34.1 (H)  26.0 - 34.0 pg Final   MCHC 11/11/2022 35.4  30.0 - 36.0 g/dL Final   RDW 18/84/1660 13.1  11.5 - 15.5 % Final   Platelets 11/11/2022 279  150 - 400 K/uL Final   nRBC 11/11/2022 0.0  0.0 - 0.2 % Final   Performed at [facility], [address] [place], [place] [address]   Color, Urine 11/11/2022 YELLOW  YELLOW Final   APPearance 11/11/2022 CLEAR  CLEAR Final   Specific Gravity, Urine 11/11/2022 1.008  1.005 - 1.030 Final   pH 11/11/2022 6.0  5.0 - 8.0 Final   Glucose, UA 11/11/2022 NEGATIVE  NEGATIVE mg/dL Final   Hgb urine dipstick 11/11/2022 NEGATIVE  NEGATIVE Final   Bilirubin Urine 11/11/2022 NEGATIVE  NEGATIVE Final   Ketones, ur 11/11/2022 NEGATIVE  NEGATIVE mg/dL Final   Protein, ur 07/24/3233 NEGATIVE  NEGATIVE mg/dL Final   Nitrite 57/32/2025 NEGATIVE  NEGATIVE Final   Leukocytes,Ua 11/11/2022 NEGATIVE  NEGATIVE Final   Performed at [facility], [address], [place], [place] [address]   Lactic Acid, Venous 11/11/2022 1.0  0.5 - 1.9 mmol/L Final   Performed at [facility], [address] [place], [place] [address]   Lactic Acid, Venous 11/11/2022 1.1  0.5 - 1.9 mmol/L Final   Performed at [facility], [address] [place], [place] [address]  Admission on 10/15/2022, Discharged on 10/16/2022  Component Date Value Ref Range Status   Sodium 10/15/2022 143  135 - 145 mmol/L Final   Potassium 10/15/2022 3.7  3.5 - 5.1 mmol/L Final   Chloride 10/15/2022 107  98 - 111 mmol/L Final   CO2 10/15/2022 25  22 - 32 mmol/L Final   Glucose, Bld 10/15/2022 90  70 - 99 mg/dL Final   Glucose reference range applies only to samples taken after fasting for at least 8 hours.   BUN 10/15/2022 12  6 - 20 mg/dL Final   Creatinine, Ser 10/15/2022 0.80  0.61 - 1.24 mg/dL Final   Calcium 91/47/8295 8.7 (L)  8.9 - 10.3 mg/dL Final   Total Protein 62/13/0865 7.4  6.5 - 8.1 g/dL Final   Albumin 78/46/9629 4.5  3.5 - 5.0 g/dL Final   AST  52/84/1324 33  15 - 41 U/L Final   ALT 10/15/2022 52 (H)  0 - 44 U/L Final   Alkaline Phosphatase 10/15/2022 50  38 - 126 U/L Final   Total Bilirubin 10/15/2022 0.5  0.3 - 1.2 mg/dL Final   GFR, Estimated 10/15/2022 >60  >60 mL/min Final   Comment: (NOTE) Calculated using the CKD-EPI Creatinine Equation (2021)    Anion gap 10/15/2022 11  5 - 15 Final   Performed at [facility], [address] [place], [place] [address]   Alcohol, Ethyl (B) 10/15/2022 376 (HH)  <10 mg/dL Final   Comment: CRITICAL RESULT CALLED TO, READ BACK BY AND VERIFIED WITH rn b oldend at 2027 10/15/22 [place] a (NOTE) Lowest detectable limit for serum alcohol is 10 mg/dL.  For medical purposes only. Performed at [facility], [address] [place], [place] [address]    WBC 10/15/2022 5.4  4.0 - 10.5 K/uL Final   RBC 10/15/2022 3.99 (L)  4.22 - 5.81 MIL/uL Final   Hemoglobin 10/15/2022 13.5  13.0 - 17.0 g/dL Final   HCT 64/40/3474 39.6  39.0 - 52.0 % Final   MCV 10/15/2022 99.2  80.0 - 100.0 fL Final   MCH 10/15/2022 33.8  26.0 - 34.0 pg Final   MCHC 10/15/2022 34.1  30.0 - 36.0 g/dL Final   RDW 25/95/6387 14.3  11.5 - 15.5 % Final   Platelets 10/15/2022 424 (H)  150 - 400 K/uL Final   nRBC 10/15/2022 0.0  0.0 - 0.2 % Final   Performed at [facility], [address], [place], [place] [address]   Opiates 10/15/2022 NONE DETECTED  NONE DETECTED Final   Cocaine 10/15/2022 NONE DETECTED  NONE DETECTED Final   Benzodiazepines 10/15/2022 NONE DETECTED  NONE DETECTED Final   Amphetamines 10/15/2022 NONE DETECTED  NONE DETECTED Final   Tetrahydrocannabinol 10/15/2022 NONE DETECTED  NONE DETECTED Final   Barbiturates 10/15/2022 NONE DETECTED  NONE DETECTED Final   Comment: (NOTE) DRUG SCREEN FOR MEDICAL PURPOSES ONLY.  IF CONFIRMATION IS NEEDED FOR ANY PURPOSE, NOTIFY LAB WITHIN 5 DAYS.  LOWEST DETECTABLE LIMITS FOR URINE DRUG SCREEN Drug Class  Cutoff (ng/mL) Amphetamine and metabolites    1000 Barbiturate and metabolites    200 Benzodiazepine                 200 Opiates and metabolites        300 Cocaine and metabolites        300 THC                            50 Performed at [facility] [place] [facility], [address], [place], [place] [address]    Acetaminophen (Tylenol), Serum 10/15/2022 <10 (L)  10 - 30 ug/mL Final   Comment: (NOTE) Therapeutic concentrations vary significantly. A range of 10-30 ug/mL  may be an effective concentration for many patients. However, some  are best treated at concentrations outside of this range. Acetaminophen concentrations >150 ug/mL at 4 hours after ingestion  and >50 ug/mL at 12 hours after ingestion are often associated with  toxic reactions.  Performed at [facility], [address], [place], [place] [address]    Salicylate Lvl 10/15/2022 <7.0 (L)  7.0 - 30.0 mg/dL Final   Performed at [facility], [address] [place], [place] [address]  Admission on 09/08/2022, Discharged on 09/09/2022  Component Date Value Ref Range Status   Sodium 09/09/2022 138  135 - 145 mmol/L Final   Potassium 09/09/2022 3.3 (L)  3.5 - 5.1 mmol/L Final   Chloride 09/09/2022 101  98 - 111 mmol/L Final   CO2 09/09/2022 25  22 - 32 mmol/L Final   Glucose, Bld 09/09/2022 85  70 - 99 mg/dL Final   Glucose reference range applies only to samples taken after fasting for at least 8 hours.   BUN 09/09/2022 12  6 - 20 mg/dL Final   Creatinine, Ser 09/09/2022 0.74  0.61 - 1.24 mg/dL Final   Calcium 62/37/6283 8.8 (L)  8.9 - 10.3 mg/dL Final   Total Protein 15/17/6160 6.7  6.5 - 8.1 g/dL Final   Albumin 73/71/0626 4.5  3.5 - 5.0 g/dL Final   AST 94/85/4627 30  15 - 41 U/L Final   ALT 09/09/2022 20  0 - 44 U/L Final   Alkaline Phosphatase 09/09/2022 59  38 - 126 U/L Final   Total Bilirubin 09/09/2022 0.6  0.3 - 1.2 mg/dL Final   GFR, Estimated  09/09/2022 >60  >60 mL/min Final   Comment: (NOTE) Calculated using the CKD-EPI Creatinine Equation (2021)    Anion gap 09/09/2022 12  5 - 15 Final   Performed at [facility], [address], [place], [place] [address]   WBC 09/09/2022 7.5  4.0 - 10.5 K/uL Final   RBC 09/09/2022 4.11 (L)  4.22 - 5.81 MIL/uL Final   Hemoglobin 09/09/2022 13.3  13.0 - 17.0 g/dL Final   HCT 93/81/8299 39.5  39.0 - 52.0 % Final   MCV 09/09/2022 96.1  80.0 - 100.0 fL Final   MCH 09/09/2022 32.4  26.0 - 34.0 pg Final   MCHC 09/09/2022 33.7  30.0 - 36.0 g/dL Final   RDW 37/16/9678 14.0  11.5 - 15.5 % Final   Platelets 09/09/2022 308  150 - 400 K/uL Final   nRBC 09/09/2022 0.0  0.0 - 0.2 % Final   Neutrophils Relative % 09/09/2022 60  % Final   Neutro Abs 09/09/2022 4.6  1.7 - 7.7 K/uL Final   Lymphocytes Relative 09/09/2022 32  % Final  Lymphs Abs 09/09/2022 2.4  0.7 - 4.0 K/uL Final   Monocytes Relative 09/09/2022 6  % Final   Monocytes Absolute 09/09/2022 0.4  0.1 - 1.0 K/uL Final   Eosinophils Relative 09/09/2022 1  % Final   Eosinophils Absolute 09/09/2022 0.1  0.0 - 0.5 K/uL Final   Basophils Relative 09/09/2022 1  % Final   Basophils Absolute 09/09/2022 0.0  0.0 - 0.1 K/uL Final   Immature Granulocytes 09/09/2022 0  % Final   Abs Immature Granulocytes 09/09/2022 0.01  0.00 - 0.07 K/uL Final   Performed at [facility], [address], [place], [place] [address]   Magnesium 09/09/2022 2.3  1.7 - 2.4 mg/dL Final   Performed at [facility], [address], [place], [place] [address]    Allergies: Penicillins  Medications:  PTA Medications  Medication Sig   dicyclomine (BENTYL) 20 MG tablet Take 1 tablet (20 mg total) by mouth 4 (four) times daily -  before meals and at bedtime for 10 days. (Patient not taking: Reported on 11/11/2022)   hydrOXYzine (ATARAX) 25 MG tablet Take 1 tablet (25 mg total) by mouth every 8 (eight) hours as needed.  (Patient not taking: Reported on 11/11/2022)   aspirin 500 MG EC tablet Take 500 mg by mouth every 6 (six) hours as needed for pain.   predniSONE (STERAPRED UNI-PAK 21 TAB) 10 MG (21) TBPK tablet Take by mouth daily. Take 6 tabs by mouth daily  for 2 days, then 5 tabs for 2 days, then 4 tabs for 2 days, then 3 tabs for 2 days, 2 tabs for 2 days, then 1 tab by mouth daily for 2 days (Patient not taking: Reported on 12/13/2022)   triamcinolone cream (KENALOG) 0.1 % Apply 1 Application topically 2 (two) times daily.   ibuprofen (ADVIL) 600 MG tablet Take 1 tablet (600 mg total) by mouth every 6 (six) hours as needed. (Patient not taking: Reported on 12/13/2022)   atenolol (TENORMIN) 50 MG tablet Take 50 mg by mouth daily.   naltrexone (DEPADE) 50 MG tablet Take 50 mg by mouth daily.   escitalopram (LEXAPRO) 10 MG tablet Take 10 mg by mouth daily.   traZODone (DESYREL) 100 MG tablet Take 100 mg by mouth at bedtime.   DESVENLAFAXINE ER PO Take 1 tablet by mouth daily.   LORazepam (ATIVAN) 1 MG tablet Take 1 tablet (1 mg total) by mouth 3 (three) times daily as needed for anxiety.    Long Term Goals: Improvement in symptoms so as ready for discharge  Short Term Goals: Patient will verbalize feelings in meetings with treatment team members., Patient will attend at least of 50% of the groups daily., Pt will complete the PHQ9 on admission, day 3 and discharge., Patient will participate in completing the [place] Suicide Severity Rating Scale, Patient will score a low risk of violence for 24 hours prior to discharge, and Patient will take medications as prescribed daily.  Medical Decision Making  Patient remains voluntary.  He will be admitted to facility based crisis unit.  Current medications: -Acetaminophen 650 mg every 6 as needed/mild pain -Maalox 30 mL oral every 4 as needed/digestion -Hydroxyzine 25 mg 3 times daily as needed/anxiety -Magnesium hydroxide 30 mL daily as needed/mild  constipation -Trazodone 50 mg nightly as needed/sleep -NicoDerm 14 mg transdermal patch daily/nicotine withdrawal  Prior to admission medications: -atenolol 50 mg daily  CIWA Ativan protocol initiated: -Loperamide 2 to 4 mg oral as needed/diarrhea or loose stools -  Lorazepam 1 mg 4 times daily x4 doses, 1 mg 3 times daily x3 doses, 1 mg 2 times daily x2 doses, 1 mg daily x1 dose -Lorazepam 1 mg every 6 hours as needed CIWA greater than 10 -Multivitamin with minerals 1 tablet daily -Ondansetron disintegrating tablet 4 mg every 6 as needed/nausea or vomiting -Thiamine injection 100 mg IM once -Thiamine tablet 100 mg daily     Recommendations  Based on my evaluation the patient does not appear to have an emergency medical condition.  Lenard Lance, FNP 02/27/23  7:44 PM

## 2023-02-27 NOTE — Group Note (Signed)
Group Topic: Recovery Basics  Group Date: 02/27/2023 Start Time: 2000 End Time: 2100 Facilitators: Rae Lips B  Department: [facility]  Number of Participants: 4  Group Focus: acceptance, activities of daily living skills, and chemical dependency education Treatment Modality:  Exposure Therapy and Leisure Development Interventions utilized were leisure development Purpose: express feelings  Name: ELIAH NEYLAND Date of Birth: 12-Oct-1970  MR: [id number]    Level of Participation: active Quality of Participation: attentive Interactions with others: gave feedback Mood/Affect: appropriate Triggers (if applicable): NA Cognition: coherent/clear Progress: Gaining insight Response: NA Plan: patient will be encouraged to keep going to groups.   Patients Problems:  Patient Active Problem List   Diagnosis Date Noted   Alcohol use disorder 02/27/2023

## 2023-02-27 NOTE — Progress Notes (Signed)
   02/27/23 1808  [facility] Triage Screening (Walk-ins at [facility] only)  How Did You Hear About [place]? Self  What Is the Reason for Your Visit/Call Today? Pt arrived to [facility] unaccompanied, saying he was told to come here from [facility] ED. Pt states that he is going through alcohol withdraws and anxiety. Pt states that he shakes really bad during withdrawal. Pt states he drinks better much all day everyday and it is causing him jobs. Pt denies SI/HI at this current moment. Pt endorses AVH at this time, saying that he sees weird things and hears the television when it isn't on. Pt states that he had a couple pints of wine last night and when he went to the ED in [place], his BAL was around 400.  How Long Has This Been Causing You Problems? 1-6 months  Have You Recently Had Any Thoughts About Hurting Yourself? No  Are You Planning to Commit Suicide/Harm Yourself At This time? No  Have you Recently Had Thoughts About Hurting Someone Karolee Ohs? No  Are You Planning To Harm Someone At This Time? No  Are you currently experiencing any auditory, visual or other hallucinations? Yes  Please explain the hallucinations you are currently experiencing: see weird things and hears the television when it isn't on  Have You Used Any Alcohol or Drugs in the Past 24 Hours? Yes  How long ago did you use Drugs or Alcohol? last night  What Did You Use and How Much? wine a couple pints  Do you have any current medical co-morbidities that require immediate attention? No  Clinician description of patient physical appearance/behavior: shaky and cooperative  What Do You Feel Would Help You the Most Today? Medication(s)  If access to [facility] was not available, would you have sought care in the Emergency Department? Yes  Determination of Need Routine (7 days)  Options For Referral Medication Management

## 2023-02-28 DIAGNOSIS — F411 Generalized anxiety disorder: Secondary | ICD-10-CM | POA: Diagnosis not present

## 2023-02-28 DIAGNOSIS — F10230 Alcohol dependence with withdrawal, uncomplicated: Secondary | ICD-10-CM | POA: Diagnosis not present

## 2023-02-28 DIAGNOSIS — F32A Depression, unspecified: Secondary | ICD-10-CM | POA: Diagnosis not present

## 2023-02-28 DIAGNOSIS — Z79899 Other long term (current) drug therapy: Secondary | ICD-10-CM | POA: Diagnosis not present

## 2023-02-28 MED ORDER — MELATONIN 3 MG PO TABS: 3.0000 mg | ORAL_TABLET | Freq: Every evening | ORAL | Status: DC | PRN

## 2023-02-28 MED ORDER — DIPHENHYDRAMINE HCL 50 MG/ML IJ SOLN: 25.0000 mg | Freq: Four times a day (QID) | INTRAMUSCULAR | Status: DC | PRN

## 2023-02-28 MED ORDER — HALOPERIDOL 5 MG PO TABS: 5.0000 mg | ORAL_TABLET | Freq: Four times a day (QID) | ORAL | Status: DC | PRN

## 2023-02-28 MED ORDER — ACETAMINOPHEN 325 MG PO TABS: 650.0000 mg | ORAL_TABLET | Freq: Four times a day (QID) | ORAL | Status: DC | PRN

## 2023-02-28 MED ORDER — NICOTINE 21 MG/24HR TD PT24
21.0000 mg | MEDICATED_PATCH | Freq: Every day | TRANSDERMAL | Status: DC | PRN
  Administered 2023-03-01: 21 mg via TRANSDERMAL
  Filled 2023-02-28: qty 1

## 2023-02-28 MED ORDER — ONDANSETRON HCL 4 MG PO TABS: 8.0000 mg | ORAL_TABLET | Freq: Three times a day (TID) | ORAL | Status: DC | PRN

## 2023-02-28 MED ORDER — LORAZEPAM 2 MG/ML IJ SOLN: 1.0000 mg | Freq: Four times a day (QID) | INTRAMUSCULAR | Status: DC | PRN

## 2023-02-28 MED ORDER — LORAZEPAM 1 MG PO TABS: 1.0000 mg | ORAL_TABLET | Freq: Four times a day (QID) | ORAL | Status: DC | PRN

## 2023-02-28 MED ORDER — HALOPERIDOL LACTATE 5 MG/ML IJ SOLN: 5.0000 mg | Freq: Four times a day (QID) | INTRAMUSCULAR | Status: DC | PRN

## 2023-02-28 MED ORDER — SENNA 8.6 MG PO TABS: 1.0000 | ORAL_TABLET | Freq: Every evening | ORAL | Status: DC | PRN

## 2023-02-28 MED ORDER — NICOTINE 14 MG/24HR TD PT24: 14.0000 mg | MEDICATED_PATCH | Freq: Every day | TRANSDERMAL | Status: DC | PRN

## 2023-02-28 MED ORDER — ATENOLOL 25 MG PO TABS
25.0000 mg | ORAL_TABLET | Freq: Every day | ORAL | Status: DC
  Administered 2023-02-28 – 2023-03-01 (×2): 25 mg via ORAL
  Filled 2023-02-28 (×2): qty 1

## 2023-02-28 MED ORDER — TRAZODONE HCL 50 MG PO TABS
50.0000 mg | ORAL_TABLET | Freq: Every evening | ORAL | Status: DC | PRN
  Administered 2023-02-28: 50 mg via ORAL
  Filled 2023-02-28: qty 1

## 2023-02-28 MED ORDER — DIPHENHYDRAMINE HCL 25 MG PO CAPS: 25.0000 mg | ORAL_CAPSULE | Freq: Four times a day (QID) | ORAL | Status: DC | PRN

## 2023-02-28 MED ORDER — HYDROXYZINE HCL 25 MG PO TABS: 25.0000 mg | ORAL_TABLET | Freq: Three times a day (TID) | ORAL | Status: DC | PRN

## 2023-02-28 MED ORDER — NICOTINE POLACRILEX 2 MG MT GUM: 2.0000 mg | CHEWING_GUM | OROMUCOSAL | Status: DC | PRN

## 2023-02-28 MED ORDER — LORAZEPAM 1 MG PO TABS: 1.0000 mg | ORAL_TABLET | ORAL | Status: DC | PRN

## 2023-02-28 MED ORDER — ALUM & MAG HYDROXIDE-SIMETH 200-200-20 MG/5ML PO SUSP: 30.0000 mL | ORAL | Status: DC | PRN

## 2023-02-28 MED ORDER — BISMUTH SUBSALICYLATE 262 MG PO CHEW: 524.0000 mg | CHEWABLE_TABLET | ORAL | Status: DC | PRN

## 2023-02-28 MED ORDER — POLYETHYLENE GLYCOL 3350 17 G PO PACK: 17.0000 g | PACK | Freq: Every day | ORAL | Status: DC | PRN

## 2023-02-28 NOTE — ED Notes (Signed)
Patient eating dinner.

## 2023-02-28 NOTE — ED Notes (Signed)
Lunch given.

## 2023-02-28 NOTE — ED Provider Notes (Addendum)
Behavioral Health Progress Note  Date and Time: 02/28/2023 9:44 AM Name: TERRALL HINZMAN MRN:  [id number]  Subjective: Patient reports his mood as "ok". Patient notes his appetite and sleep are good. Patient would like to restart trazadone for sleep, which he has taken in the past. Patient endorses seeing "spider webs" and insects in the corner of his room last night that were not there. Patient denies hearing things recently but has in the past. He clarifies he takes atenolol 25 mg daily (not 50 mg). He endorses experiencing cravings for the following substances: nicotine . He endorses experiencing withdrawal symptoms at this time, specifically: shakes .   Patient says he would like to restart his Pristiq. Informed patient this medicine is not currently on our formulary and he will have to restart this after he leaves the facility.  Patient is interested in seeking care at a residential facility.  Patient does not endorse any side-effects they attribute to medications. Patient does not endorse any somatic complaints  Diagnosis:  Final diagnoses:  None    Total Time spent with patient: 1 hour   Past Medical History: none reported Family History: mother- MDD Social History: resides with parents, unemployed  Sleep: Fair  Appetite: Good  Current Medications: Current Facility-Administered Medications  Medication Dose Route Frequency Provider Last Rate Last Admin   acetaminophen (TYLENOL) tablet 650 mg  650 mg Oral Q6H PRN Augusto Gamble, MD       alum & mag hydroxide-simeth (MAALOX/MYLANTA) 200-200-20 MG/5ML suspension 30 mL  30 mL Oral Q4H PRN Augusto Gamble, MD       atenolol (TENORMIN) tablet 25 mg  25 mg Oral Daily Augusto Gamble, MD   25 mg at 02/28/23 0902   bismuth subsalicylate (PEPTO BISMOL) chewable tablet 524 mg  524 mg Oral Q3H PRN Augusto Gamble, MD       haloperidol (HALDOL) tablet 5 mg  5 mg Oral Q6H PRN Augusto Gamble, MD       And   LORazepam (ATIVAN) tablet 1 mg  1 mg Oral Q6H  PRN Augusto Gamble, MD       And   diphenhydrAMINE (BENADRYL) capsule 25 mg  25 mg Oral Q6H PRN Augusto Gamble, MD       haloperidol lactate (HALDOL) injection 5 mg  5 mg Intramuscular Q6H PRN Augusto Gamble, MD       And   LORazepam (ATIVAN) injection 1 mg  1 mg Intravenous Q6H PRN Augusto Gamble, MD       And   diphenhydrAMINE (BENADRYL) injection 25 mg  25 mg Intramuscular Q6H PRN Augusto Gamble, MD       hydrOXYzine (ATARAX) tablet 25 mg  25 mg Oral TID PRN Augusto Gamble, MD       LORazepam (ATIVAN) tablet 1 mg  1 mg Oral QID Lenard Lance, FNP   1 mg at 02/28/23 0902   Followed by   Melene Muller ON 03/01/2023] LORazepam (ATIVAN) tablet 1 mg  1 mg Oral TID Lenard Lance, FNP       Followed by   Melene Muller ON 03/02/2023] LORazepam (ATIVAN) tablet 1 mg  1 mg Oral BID Lenard Lance, FNP       Followed by   Melene Muller ON 03/04/2023] LORazepam (ATIVAN) tablet 1 mg  1 mg Oral Daily Lenard Lance, FNP       LORazepam (ATIVAN) tablet 1-4 mg  1-4 mg Oral Q4H PRN Augusto Gamble, MD       melatonin tablet  3 mg  3 mg Oral QHS PRN Augusto Gamble, MD       multivitamin with minerals tablet 1 tablet  1 tablet Oral Daily Lenard Lance, FNP   1 tablet at 02/28/23 3235   nicotine (NICODERM CQ - dosed in mg/24 hours) patch 14 mg  14 mg Transdermal Daily Lenard Lance, FNP   14 mg at 02/28/23 0901   ondansetron (ZOFRAN) tablet 8 mg  8 mg Oral Q8H PRN Augusto Gamble, MD       polyethylene glycol (MIRALAX / GLYCOLAX) packet 17 g  17 g Oral Daily PRN Augusto Gamble, MD       senna (SENOKOT) tablet 8.6 mg  1 tablet Oral QHS PRN Augusto Gamble, MD       thiamine (VITAMIN B1) tablet 100 mg  100 mg Oral Daily Lenard Lance, FNP   100 mg at 02/28/23 0902   Current Outpatient Medications  Medication Sig Dispense Refill   atenolol (TENORMIN) 50 MG tablet Take 50 mg by mouth daily.     Desvenlafaxine ER 100 MG TB24 Take 1 tablet by mouth daily.     nicotine (NICODERM CQ - DOSED IN MG/24 HOURS) 21 mg/24hr patch Place 21 mg onto the skin daily.     traZODone  (DESYREL) 100 MG tablet Take 100 mg by mouth at bedtime.      Labs  Lab Results:     Latest Ref Rng & Units 02/27/2023    7:28 PM 01/19/2023   12:29 PM 12/13/2022    7:35 PM  CBC  WBC 4.0 - 10.5 K/uL 5.3  9.4  3.6   Hemoglobin 13.0 - 17.0 g/dL 57.3  22.0  25.4   Hematocrit 39.0 - 52.0 % 37.6  42.6  35.7   Platelets 150 - 400 K/uL 231  355  106       Latest Ref Rng & Units 02/27/2023    7:28 PM 01/19/2023   12:29 PM 12/13/2022    7:35 PM  CMP  Glucose 70 - 99 mg/dL 98  270  76   BUN 6 - 20 mg/dL 8  17  11    Creatinine 0.61 - 1.24 mg/dL 6.23  7.62  8.31   Sodium 135 - 145 mmol/L 135  134  134   Potassium 3.5 - 5.1 mmol/L 3.5  3.5  3.2   Chloride 98 - 111 mmol/L 100  92  96   CO2 22 - 32 mmol/L 24  27  25    Calcium 8.9 - 10.3 mg/dL 8.4  9.8  9.2   Total Protein 6.5 - 8.1 g/dL 5.5  7.9  7.1   Total Bilirubin 0.3 - 1.2 mg/dL 0.8  1.6  1.7   Alkaline Phos 38 - 126 U/L 51  66  60   AST 15 - 41 U/L 51  36  121   ALT 0 - 44 U/L 39  32  104     Blood Alcohol level:  Lab Results  Component Value Date   ETH 35 (H) 02/27/2023   ETH <10 01/19/2023   Metabolic Disorder Labs: No results found for: "HGBA1C", "MPG" No results found for: "PROLACTIN" No results found for: "CHOL", "TRIG", "HDL", "CHOLHDL", "VLDL", "LDLCALC" Therapeutic Lab Levels: No results found for: "LITHIUM" No results found for: "VALPROATE" No results found for: "CBMZ" Physical Findings   PHQ2-9    Flowsheet Row ED from 02/27/2023 in [facility] Most recent reading at 02/27/2023  7:50 PM  ED from 02/27/2023 in [facility] Most recent reading at 02/27/2023  6:54 PM  PHQ-2 Total Score 5 5  PHQ-9 Total Score 17 17      Flowsheet Row ED from 02/27/2023 in [facility] Most recent reading at 02/27/2023 11:36 PM ED from 02/27/2023 in [facility] Most recent reading at 02/27/2023  6:19 PM ED from 01/19/2023 in  [facility] Emergency Department at [facility] Most recent reading at 01/19/2023 12:00 PM  C-SSRS RISK CATEGORY No Risk No Risk No Risk       Musculoskeletal  Strength & Muscle Tone: within normal limits Gait & Station: normal Patient leans: N/A  Psychiatric Specialty Exam  Presentation  General Appearance: Appropriate for Environment   Eye Contact:Good   Speech:Clear and Coherent   Speech Volume:Normal   Handedness:-- (not assessed)   Mood and Affect  Mood:-- ("ok")   Affect:Congruent   Thought Process  Thought Processes:Coherent; Goal Directed   Descriptions of Associations:Intact   Orientation:Full (Time, Place and Person)   Thought Content:Logical  Diagnosis of Schizophrenia or Schizoaffective disorder in past: No data recorded   Hallucinations:Hallucinations: None   Ideas of Reference:None   Suicidal Thoughts:Suicidal Thoughts: No   Homicidal Thoughts:Homicidal Thoughts: No   Sensorium  Memory:Immediate Good; Recent Good; Remote Good   Judgment:Fair   Insight:Fair   Executive Functions  Concentration:Fair   Attention Span:Good   Recall:Good   Fund of Knowledge:Good   Language:Good   Psychomotor Activity  Psychomotor Activity:Psychomotor Activity: Normal   Assets  Assets:Desire for Improvement; Social Support   Sleep  Sleep:Sleep: Fair   Nutritional Assessment (For OBS and FBC admissions only) Has the patient had a weight loss or gain of 10 pounds or more in the last 3 months?: No Has the patient had a decrease in food intake/or appetite?: No Does the patient have dental problems?: No Does the patient have eating habits or behaviors that may be indicators of an eating disorder including binging or inducing vomiting?: No Has the patient recently lost weight without trying?: 0 Has the patient been eating poorly because of a decreased appetite?: 0 Malnutrition Screening Tool Score: 0    Physical  Exam  Physical Exam Vitals reviewed.  Constitutional:      General: He is not in acute distress. HENT:     Head: Normocephalic and atraumatic.  Pulmonary:     Effort: Pulmonary effort is normal.  Neurological:     General: No focal deficit present.    Review of Systems  Constitutional:  Positive for chills. Negative for diaphoresis and malaise/fatigue.  Gastrointestinal:  Negative for nausea and vomiting.  Psychiatric/Behavioral:  Negative for depression, hallucinations and suicidal ideas. The patient is not nervous/anxious and does not have insomnia.    Blood pressure 137/88, pulse 69, temperature 97.9 F (36.6 C), temperature source Oral, resp. rate 20, SpO2 99%. There is no height or weight on file to calculate BMI.  Treatment Plan Summary: Daily contact with patient to assess and evaluate symptoms and progress in treatment and Medication management: -- CIWA with scheduled and as needed lorazepam protocol -- continue home atenolol -- Patient in need of nicotine replacement; nicotine polacrilex (gum) and nicotine patch 21 mg / 24 hours ordered. Smoking cessation encouraged  PRNs              -- continue acetaminophen 650 mg every 6 hours as needed for mild to moderate pain, fever, and headaches              --  continue hydroxyzine 25 mg three times a day as needed for anxiety              -- continue bismuth subsalicylate 524 mg oral chewable tablet every 3 hours as needed for diarrhea / loose stools              -- continue senna 8.6 mg oral at bedtime and polyethylene glycol 17 g oral daily as needed for mild to moderate constipation              -- continue ondansetron 8 mg every 8 hours as needed for nausea or vomiting              -- continue aluminum-magnesium hydroxide + simethicone 30 mL every 4 hours as needed for heartburn or indigestion              -- continue trazodone 50 mg at bedtime as needed for insomnia  -- As needed agitation protocol in-place  Augusto Gamble,  MD 02/28/23 9:44 AM

## 2023-02-28 NOTE — ED Notes (Signed)
Patient A&Ox4. Denies intent to harm self/others when asked. Denies A/VH. Patient c/o mild tremors when asked about withdrawal sx. None observed during assessment. Pt received scheduled medication according to withdrawal protocol. Tolerated well. No further complaints noted. Support and encouragement provided. Routine safety checks conducted according to facility protocol. Encouraged patient to notify staff if thoughts of harm toward self or others arise. Patient verbalize understanding and agreement. Will continue to monitor for safety.

## 2023-02-28 NOTE — ED Notes (Signed)
Pt sitting in dayroom interacting with peers and watching tv. No acute distress noted. No concerns voiced. Informed pt to notify staff with any needs or assistance. Pt verbalized understanding or agreement. Will continue to monitor for safety. 

## 2023-02-28 NOTE — Group Note (Signed)
Group Topic: Communication  Group Date: 02/28/2023 Start Time: 2000 End Time: 2050 Facilitators: Rae Lips B  Department: [facility]  Number of Participants: 2  Group Focus: abuse issues, acceptance, activities of daily living skills, chemical dependency education, communication, community group, coping skills, daily focus, depression, diagnosis education, dual diagnosis, and feeling awareness/expression Treatment Modality:  Individual Therapy and Psychoeducation Interventions utilized were leisure development, patient education, problem solving, and support Purpose: enhance coping skills, express feelings, express irrational fears, increase insight, and relapse prevention strategies  Name: ELLWOOD EADE Date of Birth: 01-Aug-1970  MR: [id number]    Level of Participation: active Quality of Participation: attentive, defensive, and redirectable Interactions with others: gave feedback Mood/Affect: appropriate Triggers (if applicable): NA Cognition: coherent/clear Progress: Gaining insight Response: NA Plan: patient will be encouraged to keep going to groups.   Patients Problems:  Patient Active Problem List   Diagnosis Date Noted   Alcohol use disorder 02/27/2023

## 2023-02-28 NOTE — ED Notes (Signed)
Patient A&Ox4. Patient admitted to the unit for Alcohol detox.  Patient denies SI/Hi and AVH. Denies any physical complaints when asked. No acute distress noted. No acute alcohol withdrawal note. Support and encouragement provided. Routine safety checks conducted according to facility protocol. Encouraged patient to notify staff if thoughts of harm toward self or others arise. Patient verbalize understanding and agreement. Will continue to monitor for safety.

## 2023-02-28 NOTE — Group Note (Signed)
Group Topic: Wellness  Group Date: 02/28/2023 Start Time: 1130 End Time: 1215 Facilitators: Prentice Docker, RN  Department: [facility]  Number of Participants: 5  Group Focus: communication Treatment Modality:  Individual Therapy Interventions utilized were patient education Purpose: increase insight  Name: Joseph Walters Date of Birth: [birth year]  MR: [id number]    Level of Participation: active Quality of Participation: attentive Interactions with others: gave feedback Mood/Affect: appropriate Triggers (if applicable): n/a Cognition: coherent/clear Progress: Gaining insight Response: n/a Plan: patient will be encouraged to continue tx progression  Patients Problems:  Patient Active Problem List   Diagnosis Date Noted   Alcohol use disorder 02/27/2023

## 2023-03-01 DIAGNOSIS — F411 Generalized anxiety disorder: Secondary | ICD-10-CM | POA: Diagnosis not present

## 2023-03-01 DIAGNOSIS — Z79899 Other long term (current) drug therapy: Secondary | ICD-10-CM | POA: Diagnosis not present

## 2023-03-01 DIAGNOSIS — F10230 Alcohol dependence with withdrawal, uncomplicated: Secondary | ICD-10-CM | POA: Diagnosis not present

## 2023-03-01 DIAGNOSIS — F32A Depression, unspecified: Secondary | ICD-10-CM | POA: Diagnosis not present

## 2023-03-01 MED ORDER — ATENOLOL 25 MG PO TABS: 25.0000 mg | ORAL_TABLET | Freq: Every day | ORAL | 0 refills | Status: AC

## 2023-03-01 MED ORDER — TRAZODONE HCL 50 MG PO TABS: 50.0000 mg | ORAL_TABLET | Freq: Every evening | ORAL | 0 refills | Status: AC | PRN

## 2023-03-01 MED ORDER — NICOTINE 21 MG/24HR TD PT24: 21.0000 mg | MEDICATED_PATCH | Freq: Every day | TRANSDERMAL | 0 refills | Status: AC | PRN

## 2023-03-01 NOTE — ED Provider Notes (Signed)
FBC/OBS ASAP Discharge Summary  Date: 03/01/23 Name: Joseph Walters MRN: [id number]  Discharge Diagnoses:  Final diagnoses:  Alcohol withdrawal syndrome without complication (HCC)   Joseph Walters is a 52 y.o. male with past medical history significant for alcohol abuse, depression, substance induced mood disorder admitted for treatment of alcohol withdrawal.  Subjective: Patient says he wants to leave by noon. Patient denies having suicidal or homicidal thoughts. He is not hearing any voices others don't hear or seeing things others don't see. He denies experiencing any withdrawal symptoms.  Stay Summary: During the patient's hospitalization, patient had extensive initial psychiatric evaluation, and follow-up psychiatric evaluations every day.  The following meds were provided and managed during his stay. Scheduled Meds:  atenolol  25 mg Oral Daily   LORazepam  1 mg Oral QID   Followed by   LORazepam  1 mg Oral TID   Followed by   Melene Muller ON 03/02/2023] LORazepam  1 mg Oral BID   Followed by   Melene Muller ON 03/04/2023] LORazepam  1 mg Oral Daily   multivitamin with minerals  1 tablet Oral Daily   thiamine  100 mg Oral Daily   Continuous Infusions: PRN Meds:.acetaminophen, alum & mag hydroxide-simeth, bismuth subsalicylate, haloperidol **AND** LORazepam **AND** diphenhydrAMINE, haloperidol lactate **AND** LORazepam **AND** diphenhydrAMINE, hydrOXYzine, LORazepam, nicotine, nicotine polacrilex, ondansetron, polyethylene glycol, senna, traZODone  Patient's care was discussed during the interdisciplinary team meeting every day during the hospitalization.  The patient denies any side effects to prescribed psychiatric medication.  Gradually, patient started adjusting to milieu. The patient was evaluated each day by a clinical provider to ascertain response to treatment. Improvement was noted by the patient's report of decreasing symptoms, improved sleep and appetite, affect, medication  tolerance, behavior, and participation in unit programming.  Patient was asked each day to complete a self inventory noting mood, mental status, pain, new symptoms, anxiety and concerns.   Symptoms were reported as significantly decreased or resolved completely by discharge.  The patient reports that their mood is stable.  The patient denied having suicidal thoughts for more than 48 hours prior to discharge.  Patient denies having homicidal thoughts.  Patient denies having auditory hallucinations.  Patient denies any visual hallucinations or other symptoms of psychosis.  The patient was motivated to continue taking medication with a goal of continued improvement in mental health.   Symptoms were reported as significantly decreased or resolved completely by discharge.   On day of discharge, the patient reports that their mood is stable. The patient denied having suicidal thoughts for more than 48 hours prior to discharge.  Patient denies having homicidal thoughts.  Patient denies having auditory hallucinations.  Patient denies any visual hallucinations or other symptoms of psychosis. The patient was motivated to continue taking medication with a goal of continued improvement in mental health.   The patient reports their target psychiatric symptoms of alcohol withdrawal responded well to the psychiatric medications, and the patient reports overall benefit other psychiatric hospitalization. Supportive psychotherapy was provided to the patient. The patient also participated in regular group therapy while hospitalized. Coping skills, problem solving as well as relaxation therapies were also part of the unit programming.  Labs were reviewed with the patient, and abnormal results were discussed with the patient.  The patient is able to verbalize their individual safety plan to this provider.  # It is recommended to the patient to continue psychiatric medications as prescribed, after discharge from the  hospital.    # It is  recommended to the patient to follow up with your outpatient psychiatric provider and PCP.  # It was discussed with the patient, the impact of alcohol, drugs, tobacco have been there overall psychiatric and medical wellbeing, and total abstinence from substance use was recommended the patient.ed.  # Prescriptions provided or sent directly to preferred pharmacy at discharge. Patient agreeable to plan. Given opportunity to ask questions. Appears to feel comfortable with discharge.    # In the event of worsening symptoms, the patient is instructed to call the crisis hotline, 911 and or go to the nearest ED for appropriate evaluation and treatment of symptoms. To follow-up with primary care provider for other medical issues, concerns and or health care needs  # Patient was discharged to the community with plans to follow up provided in the discharge instructions.  On day of discharge patient is not suicidal or homicidal. He is not exhibiting bizarre behavior nor endorsing auditory or visual hallucinations. He is not exhibiting any life-threatening substance withdrawal symptoms. At present, there are no indications to hold patient involuntarily.  Total Time spent with patient: 45 minutes  Past Medical History: none reported Family History: mother- MDD Social History: resides with parents, unemployed Tobacco Cessation:  A prescription for an FDA-approved tobacco cessation medication provided at discharge  Current Medications:  Current Facility-Administered Medications  Medication Dose Route Frequency Provider Last Rate Last Admin   acetaminophen (TYLENOL) tablet 650 mg  650 mg Oral Q6H PRN Augusto Gamble, MD       alum & mag hydroxide-simeth (MAALOX/MYLANTA) 200-200-20 MG/5ML suspension 30 mL  30 mL Oral Q4H PRN Augusto Gamble, MD       atenolol (TENORMIN) tablet 25 mg  25 mg Oral Daily Augusto Gamble, MD   25 mg at 02/28/23 0902   bismuth subsalicylate (PEPTO BISMOL) chewable tablet  524 mg  524 mg Oral Q3H PRN Augusto Gamble, MD       haloperidol (HALDOL) tablet 5 mg  5 mg Oral Q6H PRN Augusto Gamble, MD       And   LORazepam (ATIVAN) tablet 1 mg  1 mg Oral Q6H PRN Augusto Gamble, MD       And   diphenhydrAMINE (BENADRYL) capsule 25 mg  25 mg Oral Q6H PRN Augusto Gamble, MD       haloperidol lactate (HALDOL) injection 5 mg  5 mg Intramuscular Q6H PRN Augusto Gamble, MD       And   LORazepam (ATIVAN) injection 1 mg  1 mg Intravenous Q6H PRN Augusto Gamble, MD       And   diphenhydrAMINE (BENADRYL) injection 25 mg  25 mg Intramuscular Q6H PRN Augusto Gamble, MD       hydrOXYzine (ATARAX) tablet 25 mg  25 mg Oral TID PRN Augusto Gamble, MD       LORazepam (ATIVAN) tablet 1 mg  1 mg Oral QID Lenard Lance, FNP   1 mg at 02/28/23 2134   Followed by   LORazepam (ATIVAN) tablet 1 mg  1 mg Oral TID Lenard Lance, FNP       Followed by   Melene Muller ON 03/02/2023] LORazepam (ATIVAN) tablet 1 mg  1 mg Oral BID Lenard Lance, FNP       Followed by   Melene Muller ON 03/04/2023] LORazepam (ATIVAN) tablet 1 mg  1 mg Oral Daily Lenard Lance, FNP       LORazepam (ATIVAN) tablet 1-4 mg  1-4 mg Oral Q4H PRN Augusto Gamble, MD  multivitamin with minerals tablet 1 tablet  1 tablet Oral Daily Lenard Lance, FNP   1 tablet at 02/28/23 8295   nicotine (NICODERM CQ - dosed in mg/24 hours) patch 21 mg  21 mg Transdermal Daily PRN Augusto Gamble, MD       nicotine polacrilex (NICORETTE) gum 2 mg  2 mg Oral PRN Augusto Gamble, MD       ondansetron [facility]) tablet 8 mg  8 mg Oral Q8H PRN Augusto Gamble, MD       polyethylene glycol (MIRALAX / GLYCOLAX) packet 17 g  17 g Oral Daily PRN Augusto Gamble, MD       senna (SENOKOT) tablet 8.6 mg  1 tablet Oral QHS PRN Augusto Gamble, MD       thiamine (VITAMIN B1) tablet 100 mg  100 mg Oral Daily Lenard Lance, FNP   100 mg at 02/28/23 0902   traZODone (DESYREL) tablet 50 mg  50 mg Oral QHS PRN Augusto Gamble, MD   50 mg at 02/28/23 2134   Current Outpatient Medications  Medication Sig Dispense  Refill   Desvenlafaxine ER 100 MG TB24 Take 1 tablet by mouth daily.     atenolol (TENORMIN) 25 MG tablet Take 1 tablet (25 mg total) by mouth daily. 30 tablet 0   nicotine (NICODERM CQ - DOSED IN MG/24 HOURS) 21 mg/24hr patch Place 1 patch (21 mg total) onto the skin daily as needed (nicotine cravings). 30 patch 0   traZODone (DESYREL) 50 MG tablet Take 1 tablet (50 mg total) by mouth at bedtime as needed for sleep (for difficulty initiating sleep in patients with depressive symptoms). 30 tablet 0    PTA Medications: (Not in a hospital admission)      02/27/2023    7:50 PM 02/27/2023    6:54 PM  Depression screen PHQ 2/9  Decreased Interest 2 2  Down, Depressed, Hopeless 3 3  PHQ - 2 Score 5 5  Altered sleeping 3 3  Tired, decreased energy 1 1  Change in appetite 2 2  Feeling bad or failure about yourself  3 3  Trouble concentrating 0 0  Moving slowly or fidgety/restless 3 3  Suicidal thoughts 0 0  PHQ-9 Score 17 17  Difficult doing work/chores Very difficult Very difficult    Flowsheet Row ED from 02/27/2023 in [facility] Most recent reading at 02/27/2023 11:36 PM ED from 02/27/2023 in [facility] Most recent reading at 02/27/2023  6:19 PM ED from 01/19/2023 in [facility] Emergency Department at [facility] Most recent reading at 01/19/2023 12:00 PM  C-SSRS RISK CATEGORY No Risk No Risk No Risk       Musculoskeletal  Strength & Muscle Tone: within normal limits Gait & Station: normal Patient leans: N/A  Psychiatric Specialty Exam  Presentation General Appearance: Appropriate for Environment; Well Groomed   Eye Contact:Good   Speech:Clear and Coherent; Normal Rate   Speech Volume:Normal   Handedness:-- (not assessed)   Mood and Affect  Mood:-- ("I just wanna leave")   Affect:Appropriate; Congruent (irritable)   Thought Process  Thought Processes:Coherent; Linear; Goal  Directed   Descriptions of Associations:Intact   Orientation:Full (Time, Place and Person)   Thought Content:Logical; WDL   Diagnosis of Schizophrenia or Schizoaffective disorder in past: No data recorded   Hallucinations:Hallucinations: None   Ideas of Reference:None   Suicidal Thoughts:Suicidal Thoughts: No   Homicidal Thoughts:Homicidal Thoughts: No   Sensorium  Memory:Immediate Good; Recent  Good; Remote Good   Judgment:Good   Insight:Good   Executive Functions  Concentration:Good   Attention Span:Good   Recall:Good   Fund of Knowledge:Good   Language:Good   Psychomotor Activity  Psychomotor Activity:Psychomotor Activity: Normal   Assets  Assets:Resilience   Sleep  Sleep:Sleep: Poor   No data recorded  Physical Exam  Physical Exam Vitals and nursing note reviewed.  HENT:     Head: Normocephalic and atraumatic.  Pulmonary:     Effort: Pulmonary effort is normal.  Musculoskeletal:     Cervical back: Normal range of motion.  Neurological:     General: No focal deficit present.     Mental Status: He is alert. Mental status is at baseline.    Review of Systems  Constitutional: Negative.   Respiratory: Negative.    Cardiovascular: Negative.   Gastrointestinal: Negative.   Genitourinary: Negative.   Psychiatric/Behavioral:         Psychiatric subjective data addressed in PSE or HPI / daily subjective report   Blood pressure 115/71, pulse 62, temperature 97.9 F (36.6 C), temperature source Oral, resp. rate 18, SpO2 97%. There is no height or weight on file to calculate BMI.  Demographic Factors:  Male and Low socioeconomic status  Loss Factors: Financial problems/change in socioeconomic status  Historical Factors: Family history of mental illness or substance abuse  Risk Reduction Factors:   NA  Continued Clinical Symptoms:  Alcohol/Substance Abuse/Dependencies  Cognitive Features That Contribute To Risk:   Closed-mindedness    Suicide Risk:  Mild:  Suicidal ideation of limited frequency, intensity, duration, and specificity.  There are no identifiable plans, no associated intent, mild dysphoria and related symptoms, good self-control (both objective and subjective assessment), few other risk factors, and identifiable protective factors, including available and accessible social support.  Plan Of Care/Follow-up recommendations:  Activity: as tolerated  Diet: heart healthy  Other: -Follow-up with your outpatient psychiatric provider -instructions on appointment date, time, and address (location) are provided to you in discharge paperwork.  -Take your psychiatric medications as prescribed at discharge - instructions are provided to you in the discharge paperwork  -Follow-up with outpatient primary care doctor and other specialists -for management of chronic medical disease, including: health maintenance checks  -Testing: Follow-up with outpatient provider for abnormal lab results: none  -Recommend abstinence from alcohol, tobacco, and other illicit drug use at discharge.   -If your psychiatric symptoms recur, worsen, or if you have side effects to your psychiatric medications, call your outpatient psychiatric provider, 911, [phone] or go to the nearest emergency department.  -If suicidal thoughts recur, call your outpatient psychiatric provider, 911, [phone] or go to the nearest emergency department.  Disposition: home / self-care  Augusto Gamble, MD 03/01/2023, 8:57 AM

## 2023-03-01 NOTE — Care Management (Signed)
[facility] Care Management   Writer referred patient to Lowella Bandy and [facility].

## 2023-03-01 NOTE — ED Notes (Signed)
Patient observed resting quietly, eyes closed. Respirations equal and unlabored. Will continue to monitor for safety.  

## 2023-03-01 NOTE — Discharge Instructions (Signed)
Dear Joseph Walters,  It was a pleasure to take care of you during your stay at [facility] where you were treated for your <principal problem not specified>.  While you were here, you were:  observed and cared for by our nurses and nursing assistants  treated with medications by your psychiatrists  provided individual and group therapy by therapists  provided resources by our social workers and case managers  Please review the medication list provided to you at discharge and stop, start taking, or continue taking the medications listed there.  You should also follow-up with your primary care doctor, or start seeing one if you don't have one yet. If applicable, here are some scheduled follow-ups for you:    I recommend abstinence from alcohol, tobacco, and other illicit drug use.   If your psychiatric symptoms or suicidal thoughts recur, worsen, or if you have side effects to your psychiatric medications, call your outpatient psychiatric provider, 911, [phone] or go to the nearest emergency department.  Take care!  Signed: Augusto Gamble, MD 03/01/2023, 8:50 AM  Naloxone (Narcan) can help reverse an overdose when given to the victim quickly.  [place] offers free naloxone kits and instructions/training on its use.  Add naloxone to your first aid kit and you can help save a life. A prescription can be filled at your local pharmacy or free kits are provided by the county.  Pick up your free kit at the following locations:   [place]:  [facility], [address] [place] [address] [phone]) [facility] [address] [place] [address] [phone]) [facility] [address] [place] [address]  [place]: [facility] [address] [place] [address] ([phone]) [facility] [address] [place]  [place] [address] [phone])

## 2023-05-27 ENCOUNTER — Emergency Department (HOSPITAL_COMMUNITY): Payer: MEDICAID

## 2023-05-27 ENCOUNTER — Emergency Department (HOSPITAL_COMMUNITY)
Admission: EM | Admit: 2023-05-27 | Discharge: 2023-05-27 | Disposition: A | Discharge status: Home / Self Care | Payer: MEDICAID | Attending: Emergency Medicine | Admitting: Emergency Medicine

## 2023-05-27 ENCOUNTER — Encounter (HOSPITAL_COMMUNITY): Payer: Self-pay

## 2023-05-27 ENCOUNTER — Other Ambulatory Visit: Payer: Self-pay

## 2023-05-27 DIAGNOSIS — H538 Other visual disturbances: Secondary | ICD-10-CM | POA: Insufficient documentation

## 2023-05-27 DIAGNOSIS — R519 Headache, unspecified: Secondary | ICD-10-CM | POA: Insufficient documentation

## 2023-05-27 DIAGNOSIS — R079 Chest pain, unspecified: Secondary | ICD-10-CM | POA: Diagnosis not present

## 2023-05-27 DIAGNOSIS — H1032 Unspecified acute conjunctivitis, left eye: Secondary | ICD-10-CM | POA: Diagnosis not present

## 2023-05-27 DIAGNOSIS — R42 Dizziness and giddiness: Secondary | ICD-10-CM | POA: Insufficient documentation

## 2023-05-27 DIAGNOSIS — R002 Palpitations: Secondary | ICD-10-CM | POA: Diagnosis not present

## 2023-05-27 DIAGNOSIS — Z79899 Other long term (current) drug therapy: Secondary | ICD-10-CM | POA: Insufficient documentation

## 2023-05-27 LAB — COMPREHENSIVE METABOLIC PANEL
ALT: 55 U/L — ABNORMAL HIGH (ref 0–44)
AST: 124 U/L — ABNORMAL HIGH (ref 15–41)
Albumin: 3.9 g/dL (ref 3.5–5.0)
Alkaline Phosphatase: 55 U/L (ref 38–126)
Anion gap: 11 (ref 5–15)
BUN: 9 mg/dL (ref 6–20)
CO2: 25 mmol/L (ref 22–32)
Calcium: 9 mg/dL (ref 8.9–10.3)
Chloride: 98 mmol/L (ref 98–111)
Creatinine, Ser: 0.83 mg/dL (ref 0.61–1.24)
GFR, Estimated: >60 mL/min (ref >60)
Glucose, Bld: 104 mg/dL — ABNORMAL HIGH (ref 70–99)
Potassium: 3.4 mmol/L — ABNORMAL LOW (ref 3.5–5.1)
Sodium: 134 mmol/L — ABNORMAL LOW (ref 135–145)
Total Bilirubin: 2 mg/dL — ABNORMAL HIGH (ref <1.2)
Total Protein: 6.1 g/dL — ABNORMAL LOW (ref 6.5–8.1)

## 2023-05-27 LAB — CBC WITH DIFFERENTIAL/PLATELET
Abs Immature Granulocytes: 0.02 10*3/uL (ref 0.00–0.07)
Basophils Absolute: 0 10*3/uL (ref 0.0–0.1)
Basophils Relative: 0 %
Eosinophils Absolute: 0 10*3/uL (ref 0.0–0.5)
Eosinophils Relative: 0 %
HCT: 40.3 % (ref 39.0–52.0)
Hemoglobin: 14.3 g/dL (ref 13.0–17.0)
Immature Granulocytes: 0 %
Lymphocytes Relative: 10 %
Lymphs Abs: 0.7 10*3/uL (ref 0.7–4.0)
MCH: 35.9 pg — ABNORMAL HIGH (ref 26.0–34.0)
MCHC: 35.5 g/dL (ref 30.0–36.0)
MCV: 101.3 fL — ABNORMAL HIGH (ref 80.0–100.0)
Monocytes Absolute: 0.4 10*3/uL (ref 0.1–1.0)
Monocytes Relative: 6 %
Neutro Abs: 5.4 10*3/uL (ref 1.7–7.7)
Neutrophils Relative %: 84 %
Platelets: 134 10*3/uL — ABNORMAL LOW (ref 150–400)
RBC: 3.98 MIL/uL — ABNORMAL LOW (ref 4.22–5.81)
RDW: 13 % (ref 11.5–15.5)
WBC: 6.6 10*3/uL (ref 4.0–10.5)
nRBC: 0 % (ref 0.0–0.2)

## 2023-05-27 LAB — RAPID URINE DRUG SCREEN, HOSP PERFORMED
Amphetamines: NOT DETECTED
Barbiturates: NOT DETECTED
Benzodiazepines: NOT DETECTED
Cocaine: NOT DETECTED
Opiates: NOT DETECTED
Tetrahydrocannabinol: NOT DETECTED

## 2023-05-27 LAB — TROPONIN I (HIGH SENSITIVITY)
Troponin I (High Sensitivity): 19 ng/L — ABNORMAL HIGH (ref <18)
Troponin I (High Sensitivity): 18 ng/L — ABNORMAL HIGH (ref <18)

## 2023-05-27 LAB — ETHANOL: Alcohol, Ethyl (B): <10 mg/dL (ref <10)

## 2023-05-27 LAB — LIPASE, BLOOD: Lipase: 69 U/L — ABNORMAL HIGH (ref 11–51)

## 2023-05-27 MED ORDER — LORAZEPAM 1 MG PO TABS
1.0000 mg | ORAL_TABLET | Freq: Once | ORAL | Status: AC
  Administered 2023-05-27: 1 mg via ORAL
  Filled 2023-05-27: qty 1

## 2023-05-27 MED ORDER — ERYTHROMYCIN 5 MG/GM OP OINT: TOPICAL_OINTMENT | OPHTHALMIC | 0 refills | Status: AC

## 2023-05-27 MED ORDER — CHLORDIAZEPOXIDE HCL 25 MG PO CAPS: ORAL_CAPSULE | ORAL | 0 refills | Status: AC

## 2023-05-27 NOTE — ED Notes (Signed)
Lab called to add on troponin 

## 2023-05-27 NOTE — Discharge Instructions (Addendum)
It was a pleasure care of you this evening.  You were evaluated in the emergency department for headache, blurry vision, chest pain and aching sensation and following donating plasma.  Your lab work, EKG, and head CT did not show any acute abnormality.  As discussed you are being discharged on a medication to prevent withdrawals as you continue to to decrease your alcohol consumption.  I am additionally providing you with an appointment for conjunctivitis.  Please make an appointment with your PCP within the coming week for further evaluation.  If you experience any new or worsening symptoms including worsening dizziness, blurry vision, headache please return to the emergency department.

## 2023-05-27 NOTE — ED Notes (Signed)
Pt is refusing to allow this RN to obtain discharge vitals, states "No, I have too much to do. I don't have time for you to do your little blood pressure, and you're not sticking me any more either." Pt is receptive to discharge instructions.

## 2023-05-27 NOTE — ED Provider Triage Note (Signed)
Emergency Medicine Provider Triage Evaluation Note  Joseph Walters , a 52 y.o. male  was evaluated in triage.  Pt complains of dizzy, weak.  Review of Systems  Positive: Shakes, photophobia, fast heart rate Negative: Vomiting (none today)  Physical Exam  BP 124/75   Pulse (!) 110   Temp 98.5 F (36.9 C) (Oral)   Resp 18   Ht 5\' 7"  (1.702 m)   Wt 63.5 kg   SpO2 100%   BMI 21.93 kg/m  Gen:   Awake, no distress  restless Resp:  Normal effort  MSK:   Moves extremities without difficulty  Other:  Tremors,   Medical Decision Making  Medically screening exam initiated at 12:55 PM.  Appropriate orders placed.  Joseph Walters was informed that the remainder of the evaluation will be completed by another provider, this initial triage assessment does not replace that evaluation, and the importance of remaining in the ED until their evaluation is complete.  Donating plasma today and became weak and dizzy Note: has not had any alcohol in 2 days, showing signs of withdrawal   Joseph Anis, PA-C 05/27/23 1258

## 2023-05-27 NOTE — ED Notes (Signed)
Pt refused vitals because he has not been given food

## 2023-05-27 NOTE — ED Provider Notes (Signed)
[facility] AT [facility] Provider Note   CSN: [id number] Arrival date & time: 05/27/23  1238     History  Chief Complaint  Patient presents with   Dizziness    Joseph Walters is a 52 y.o. male with EtOH abuse presents with complaints of new onset left-sided headache, dizziness, blurry vision, chest pain, palpitations that started when he was donating plasma at 9 AM this morning.  Denies any injury or trauma.  States he did been seen less than a cup of wine this morning.  States he typically consumes nearly 2 L 20% alcohol a day.  Notes he is starting to cut back his drinking.  He denies any nausea, vomiting, diarrhea, shortness of breath.   Dizziness Associated symptoms: headaches        Home Medications Prior to Admission medications   Medication Sig Start Date End Date Taking? Authorizing Provider  chlordiazePOXIDE (LIBRIUM) 25 MG capsule 50mg  PO TID x 1D, then 25-50mg  PO BID X 1D, then 25-50mg  PO QD X 1D 05/27/23  Yes Halford Decamp, PA-C  erythromycin ophthalmic ointment Place a 1/2 inch ribbon of ointment into the lower eyelid. 05/27/23  Yes Halford Decamp, PA-C  atenolol (TENORMIN) 25 MG tablet Take 1 tablet (25 mg total) by mouth daily. 03/01/23 03/31/23  Augusto Gamble, MD  Desvenlafaxine ER 100 MG TB24 Take 1 tablet by mouth daily.    [provider]  traZODone (DESYREL) 50 MG tablet Take 1 tablet (50 mg total) by mouth at bedtime as needed for sleep (for difficulty initiating sleep in patients with depressive symptoms). 03/01/23 03/31/23  Augusto Gamble, MD      Allergies    Penicillins    Review of Systems   Review of Systems  Neurological:  Positive for dizziness, tremors and headaches.    Physical Exam Updated Vital Signs BP 126/88 (BP Location: Right Arm)   Pulse 93   Temp 98.6 F (37 C) (Oral)   Resp 18   Ht 5\' 7"  (1.702 m)   Wt 63.5 kg   SpO2 98%   BMI 21.93 kg/m  Physical Exam Vitals and nursing note  reviewed.  Constitutional:      General: He is not in acute distress.    Appearance: He is well-developed.     Comments: Lying comfortably in bed  HENT:     Head: Normocephalic and atraumatic.  Eyes:     Conjunctiva/sclera: Conjunctivae normal.  Cardiovascular:     Rate and Rhythm: Normal rate and regular rhythm.     Heart sounds: No murmur heard. Pulmonary:     Effort: Pulmonary effort is normal. No respiratory distress.     Breath sounds: Normal breath sounds.  Abdominal:     Palpations: Abdomen is soft.     Tenderness: There is no abdominal tenderness.  Musculoskeletal:        General: No swelling.     Cervical back: Neck supple.  Skin:    General: Skin is warm and dry.     Capillary Refill: Capillary refill takes less than 2 seconds.  Neurological:     Mental Status: He is alert and oriented to person, place, and time.     Sensory: No sensory deficit.     Comments: He has 5 out of 5 grip strength, moves all extremities spontaneously and symmetrically.  No facial droop.  Symmetric smile.  Tongue security extremities and intentional hand tremor noted.  Sensation is intact. PERRLA.  Ambulation  not tested at this time.  Psychiatric:        Mood and Affect: Mood normal.     ED Results / Procedures / Treatments   Labs (all labs ordered are listed, but only abnormal results are displayed) Labs Reviewed  CBC WITH DIFFERENTIAL/PLATELET - Abnormal; Notable for the following components:      Result Value   RBC 3.98 (*)    MCV 101.3 (*)    MCH 35.9 (*)    Platelets 134 (*)    All other components within normal limits  LIPASE, BLOOD - Abnormal; Notable for the following components:   Lipase 69 (*)    All other components within normal limits  COMPREHENSIVE METABOLIC PANEL - Abnormal; Notable for the following components:   Sodium 134 (*)    Potassium 3.4 (*)    Glucose, Bld 104 (*)    Total Protein 6.1 (*)    AST 124 (*)    ALT 55 (*)    Total Bilirubin 2.0 (*)    All  other components within normal limits  TROPONIN I (HIGH SENSITIVITY) - Abnormal; Notable for the following components:   Troponin I (High Sensitivity) 19 (*)    All other components within normal limits  TROPONIN I (HIGH SENSITIVITY) - Abnormal; Notable for the following components:   Troponin I (High Sensitivity) 18 (*)    All other components within normal limits  ETHANOL  RAPID URINE DRUG SCREEN, HOSP PERFORMED    EKG EKG Interpretation Date/Time:  Monday May 27 2023 17:10:27 EST Ventricular Rate:  83 PR Interval:  120 QRS Duration:  84 QT Interval:  390 QTC Calculation: 458 R Axis:   73  Text Interpretation: Normal sinus rhythm Normal ECG When compared with ECG of 27-Feb-2023 19:28, PREVIOUS ECG IS PRESENT Confirmed by Alvester Chou [phone]) on 05/27/2023 5:27:07 PM  Radiology CT Head Wo Contrast  Result Date: 05/27/2023 CLINICAL DATA:  Headache, neuro deficit. Dizziness/lightheadedness after plasma donation today. EXAM: CT HEAD WITHOUT CONTRAST TECHNIQUE: Contiguous axial images were obtained from the base of the skull through the vertex without intravenous contrast. RADIATION DOSE REDUCTION: This exam was performed according to the departmental dose-optimization program which includes automated exposure control, adjustment of the mA and/or kV according to patient size and/or use of iterative reconstruction technique. COMPARISON:  Head CT 07/20/2022 FINDINGS: Brain: There is no evidence of an acute infarct, intracranial hemorrhage, mass, midline shift, or extra-axial fluid collection. A chronic infarct inferiorly in the right cerebellar hemisphere is unchanged. The ventricles are normal in size. Vascular: No hyperdense vessel. Skull: No acute fracture or suspicious osseous lesion. Partially visualized old right-sided facial fractures status post internal fixation. Sinuses/Orbits: Visualized paranasal sinuses and mastoid air cells are largely clear. No acute orbital finding.  Other: None. IMPRESSION: 1. No evidence of acute intracranial abnormality. 2. Chronic right cerebellar infarct. Electronically Signed   By: Sebastian Ache M.D.   On: 05/27/2023 19:47    Procedures Procedures    Medications Ordered in ED Medications  LORazepam (ATIVAN) tablet 1 mg (1 mg Oral Given 05/27/23 1301)  LORazepam (ATIVAN) tablet 1 mg (1 mg Oral Given 05/27/23 1840)    ED Course/ Medical Decision Making/ A&P                                 Medical Decision Making   This patient presents to the ED with chief complaint(s) of left-sided headache, dizziness, blurry  vision, chest pain, palpitations with pertinent past medical history of alcohol abuse.  The complaint involves an extensive differential diagnosis and also carries with it a high risk of complications and morbidity.    The differential diagnosis includes stroke, ACS, intracranial hemorrhage, meningitis, alcohol withdrawal, toxic ingestion, anemia  The initial plan is to CT head, EKG, initial labs  Additional history obtained: Prior notes demonstrating admission in June for SI  Initial Assessment:   Patient is overall a poor historian with history of alcohol abuse.  His neuroexam is nonfocal but history concerning enough to order CT head.  Does have tongue fasciculations on exam and notes that he is attempting to decrease his intake of alcohol.  Ativan given empirically.  Independent ECG/labs interpretation:  The following labs were independently interpreted:  Sinus rhythm without ischemic changes  Independent visualization and interpretation of imaging: I independently visualized the following imaging with scope of interpretation limited to determining acute life threatening conditions related to emergency care: CT head, which revealed no acute abnormality, chronic cerebellar infarct unchanged  Notable Labs:  Treatment and Reassessment: Patient given multiple doses of Ativan.  His imaging and labs overall  unremarkable for acute abnormality.  Upon reevaluation patient does.  I have conjunctival erythema in his left eye.  This could explain blurry vision that he has been experiencing today.  His neuroexam is overall nonfocal.  He ambulates without assistance.  Tolerates PO.   Consultations obtained:   None  Disposition:   Discharge home on Librium for alcohol cessation and rhythm medicine for conjunctivitis.  Resources provided for alcohol dependence.  The patient has been appropriately medically screened and/or stabilized in the ED. I have low suspicion for any other emergent medical condition which would require further screening, evaluation or treatment in the ED or require inpatient management. At time of discharge the patient is hemodynamically stable and in no acute distress. I have discussed work-up results and diagnosis with patient and answered all questions. Patient is agreeable with discharge plan. We discussed strict return precautions for returning to the emergency department and they verbalized understanding.     Social Determinants of Health:   Patient's lack of prescription access, unhoused, impaired access to primary care, and alcohol abuse   increases the complexity of managing their presentation          Final Clinical Impression(s) / ED Diagnoses Final diagnoses:  Dizziness  Acute conjunctivitis of left eye, unspecified acute conjunctivitis type    Rx / DC Orders ED Discharge Orders          Ordered    chlordiazePOXIDE (LIBRIUM) 25 MG capsule        05/27/23 2141    erythromycin ophthalmic ointment        05/27/23 2141              Halford Decamp, PA-C 05/27/23 2145    Melene Plan, DO 05/27/23 2148

## 2023-05-27 NOTE — ED Triage Notes (Signed)
Patient is here for evaluation after giving a plasma donation today. Pt reports after donating plasma he started feeling dizzy and light headed. Patient also reports feeling shaky and having his heart racing.

## 2024-03-01 IMAGING — CR DG CHEST 2V
2 series · 2 of 2 positions shown · non-contrast
Comparison: None.

CLINICAL DATA: Chest pain, shortness of breath.

EXAM:
CHEST - 2 VIEW

[w chest pa]
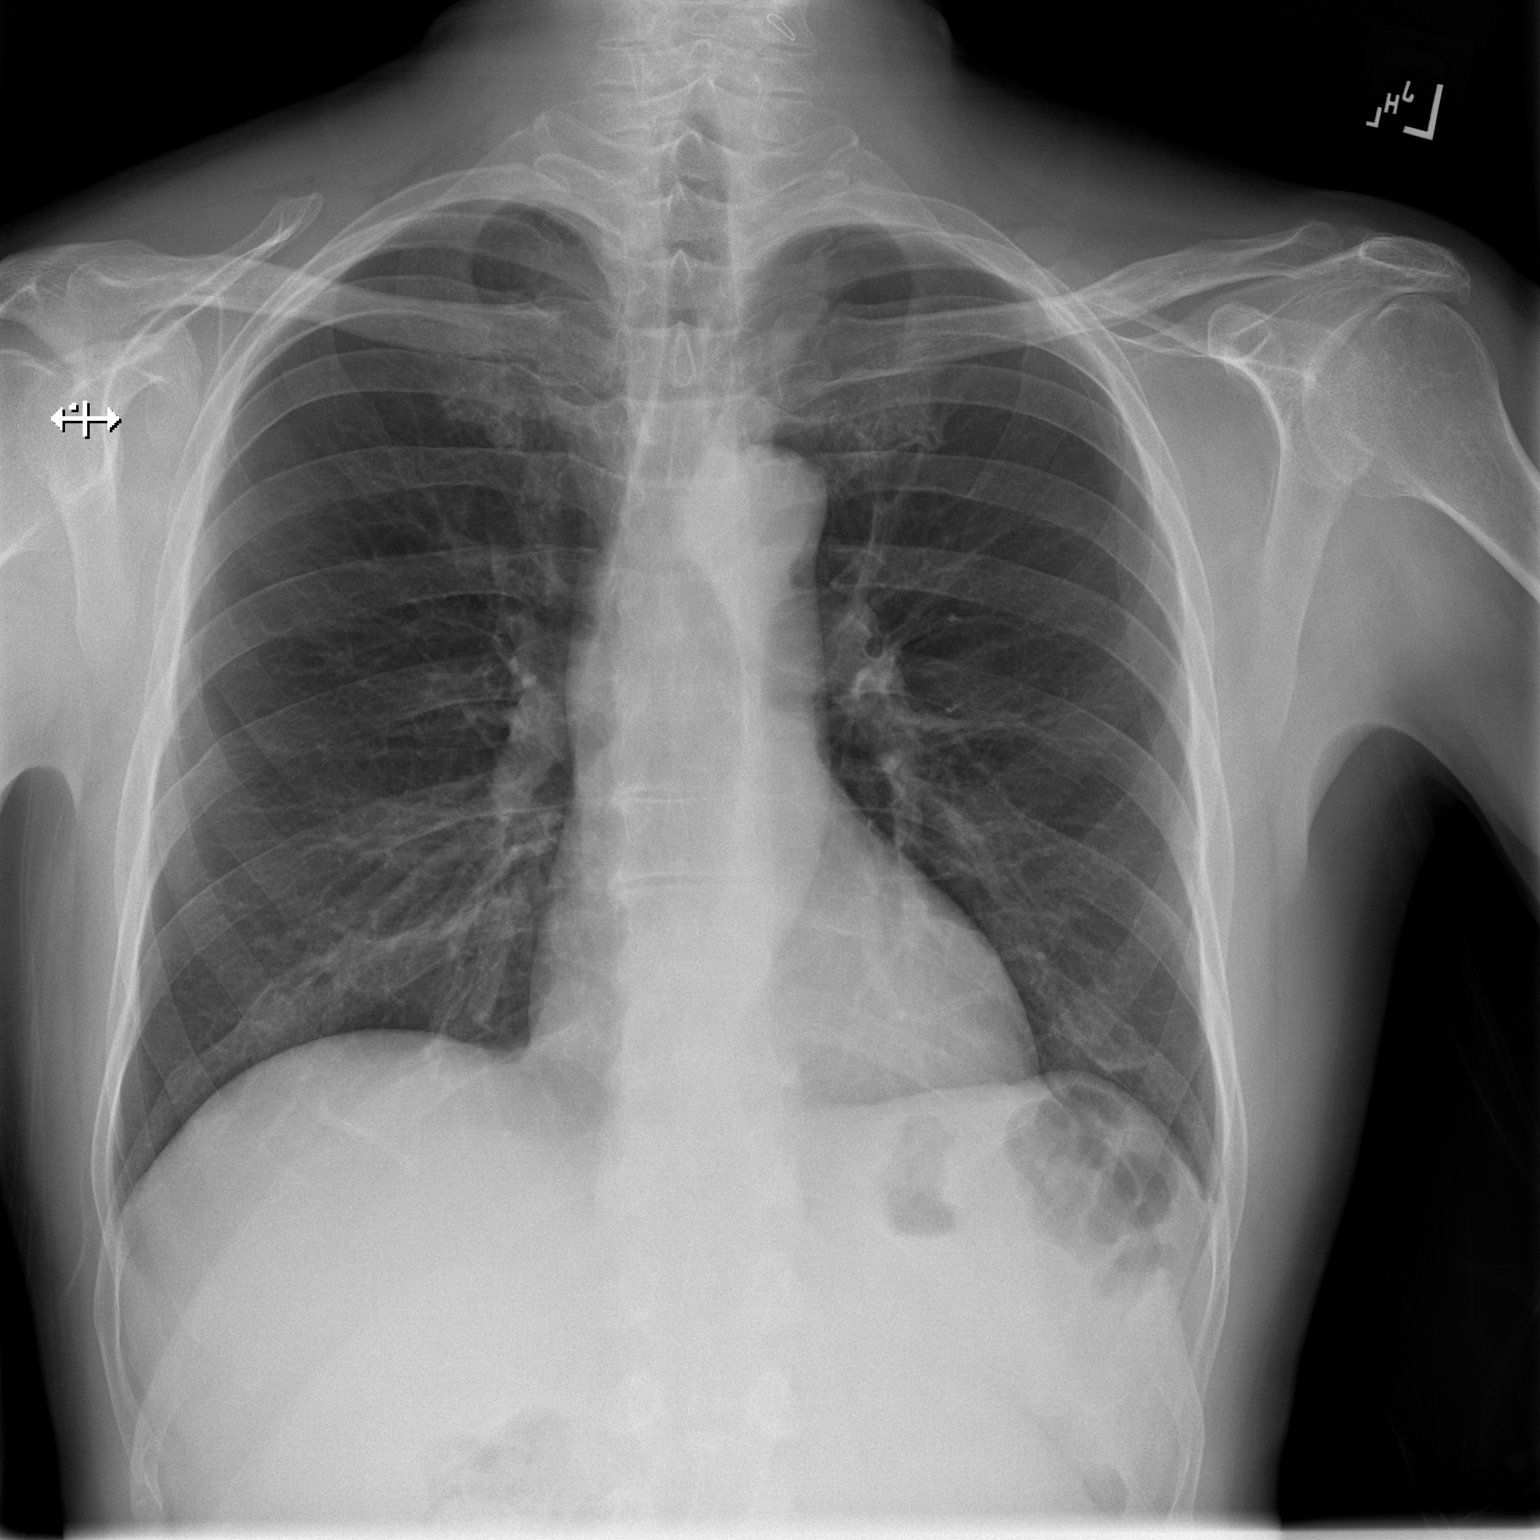

[w chest lat]
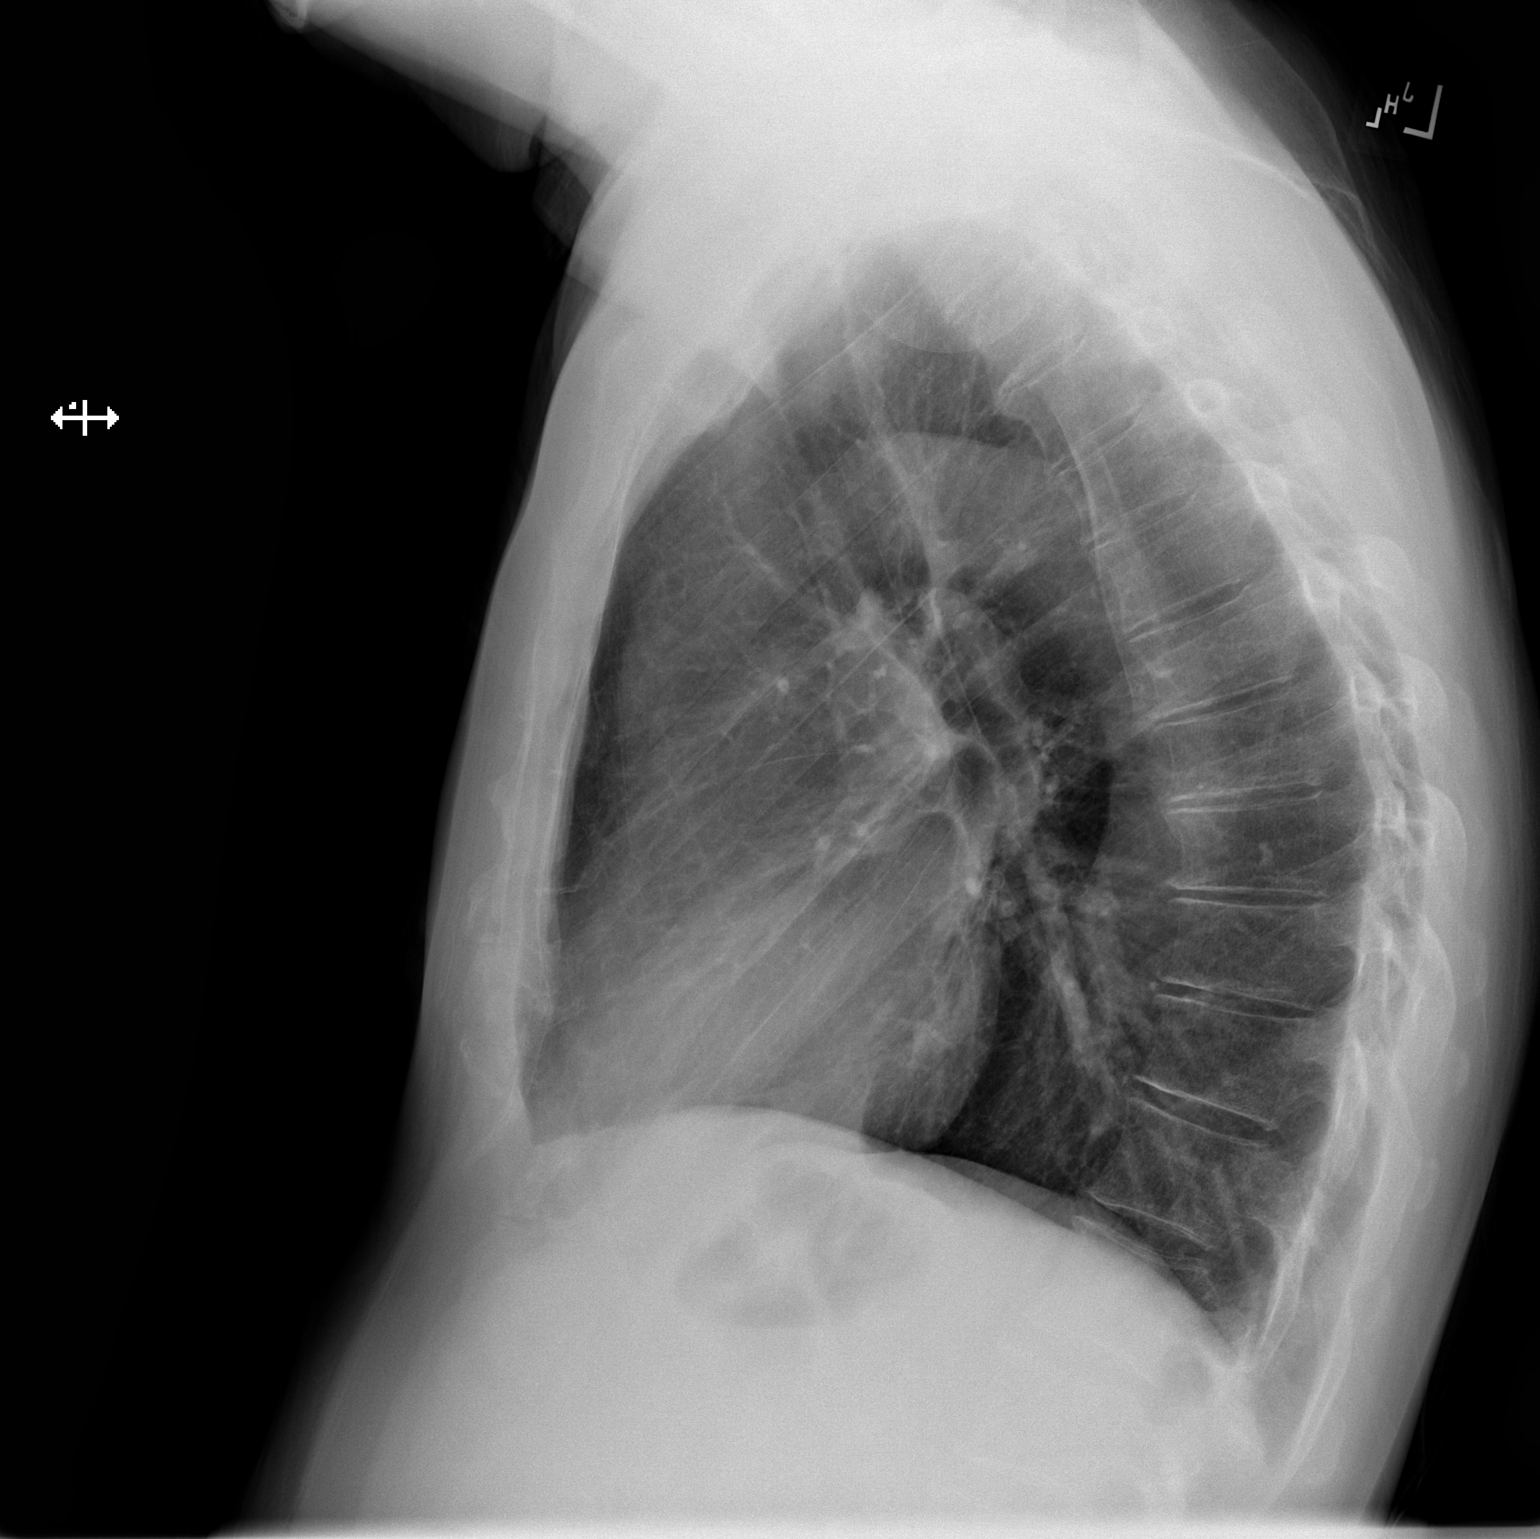

[2 of 2 positions shown; findings below may reference images not displayed]

FINDINGS: The heart size and mediastinal contours are within normal limits.
Both lungs are clear. No acute osseous abnormality.
IMPRESSION: No active cardiopulmonary disease.
